# Patient Record
Sex: Female | Born: 1990 | Race: Black or African American | Hispanic: No | Marital: Married | Smoking: Never smoker
Health system: Southern US, Community
[De-identification: ages and names within clinical notes are randomized; demographics above are authoritative.]

## PROBLEM LIST (undated history)

## (undated) DIAGNOSIS — F419 Anxiety disorder, unspecified: Secondary | ICD-10-CM

## (undated) DIAGNOSIS — K429 Umbilical hernia without obstruction or gangrene: Secondary | ICD-10-CM

## (undated) DIAGNOSIS — Z87448 Personal history of other diseases of urinary system: Secondary | ICD-10-CM

## (undated) DIAGNOSIS — N390 Urinary tract infection, site not specified: Secondary | ICD-10-CM

## (undated) DIAGNOSIS — Z87442 Personal history of urinary calculi: Secondary | ICD-10-CM

## (undated) HISTORY — DX: Urinary tract infection, site not specified: N39.0

## (undated) HISTORY — DX: Personal history of other diseases of urinary system: Z87.448

## (undated) HISTORY — DX: Personal history of urinary calculi: Z87.442

## (undated) SURGERY — Surgical Case
Anesthesia: *Unknown

---
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
## 2010-01-06 ENCOUNTER — Emergency Department (HOSPITAL_COMMUNITY): Admission: EM | Admit: 2010-01-06 | Discharge: 2010-01-06 | Payer: Self-pay | Admitting: Emergency Medicine

## 2010-03-13 ENCOUNTER — Emergency Department (HOSPITAL_COMMUNITY): Admission: EM | Admit: 2010-03-13 | Discharge: 2010-03-13 | Payer: Self-pay | Admitting: Emergency Medicine

## 2010-08-01 LAB — URINALYSIS, ROUTINE W REFLEX MICROSCOPIC
Bilirubin Urine: NEGATIVE
Glucose, UA: NEGATIVE mg/dL
Ketones, ur: NEGATIVE mg/dL
Nitrite: NEGATIVE
pH: 6.5 (ref 5.0–8.0)

## 2010-08-01 LAB — WET PREP, GENITAL: Trich, Wet Prep: NONE SEEN

## 2010-08-03 LAB — URINE MICROSCOPIC-ADD ON

## 2010-08-03 LAB — URINALYSIS, ROUTINE W REFLEX MICROSCOPIC
Bilirubin Urine: NEGATIVE
Glucose, UA: NEGATIVE mg/dL
Hgb urine dipstick: NEGATIVE
Ketones, ur: NEGATIVE mg/dL
pH: 6.5 (ref 5.0–8.0)

## 2010-10-02 NOTE — H&P (Signed)
[name], Espinoza              ACCOUNT NO.:  [id number]   MEDICAL RECORD NO.:  [id number]          PATIENT TYPE:  INP   LOCATION:  9371                          [facility]ACILITY:  [facility]   PHYSICIAN:  Roseanna Rainbow, M.Lorraine.DATE OF BIRTH:  01/05/1991   DATE OF ADMISSION:  10/27/2007  DATE OF DISCHARGE:                              HISTORY & PHYSICAL   CHIEF COMPLAINT:  The patient is an 20 year old para 1, 4 days  postpartum with elevated blood pressures and Espinoza headache.   HISTORY OF PRESENT ILLNESS:  The patient reports elevated blood  pressures during the intrapartum and immediate postpartum period.  [facility]  went to visit the patient on the day of presentation and the blood  pressures were noted to be in the 170s/90s.  The patient also complains  of Espinoza unilateral left-sided temporal headache.  She denies any other  neurological complaints.  She does have Espinoza history of allergic sinusitis  and is symptomatic with congestion at present.  She did take Espinoza dose of  Sudafed 1 day prior to presentation.   PAST GYN HISTORY/PAST MEDICAL HISTORY:  1. Headaches.  2. sinusitis.   PAST SURGICAL HISTORY:  No previous surgery.   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 Has no significant smoking  history.  Denies illicit drug use.   FAMILY HISTORY:  Alzheimer's disease, alcoholism arthritis, asthma,  breast cancer and brain cancer, liver cancer, adult-onset diabetes,  hypertension, congestive heart failure.   ALLERGIES:  No known drug allergies.   MEDICATIONS:  Please see the medication reconciliation form.   REVIEW OF SYSTEMS:  NEUROLOGICAL: Please see the above.  She denies  visual disturbances.  GI: She denies epigastric pain.  CARDIAC: She  reports lower extremity edema.   PHYSICAL EXAMINATION:  VITAL SIGNS: Blood pressures 170-180s/110-120s.  GENERAL: Minimal distress.  ABDOMEN:  Nontender.  PELVIC:  Pelvic exams deferred.  EXTREMITIES: 2-3+ pedal and pretibial edema bilaterally.   Urine dip in the office trace protein.   ASSESSMENT:  Rule out severe postpartum pregnancy-induced hypertension.  Questionable neurological symptoms versus sinusitis with an associated  headache.   PLAN:  Admission.  Magnesium sulfate, seizure prophylaxis. Will start  p.o. labetalol for blood pressure control and will check PIH labs.      Roseanna Rainbow, M.Lorraine.  Electronically Signed     LAJ/MEDQ  Lorraine:  10/27/2007  T:  10/27/2007  Job:  [id number]

## 2010-10-05 NOTE — Discharge Summary (Signed)
[name], [name]              ACCOUNT NO.:  [id number]   MEDICAL RECORD NO.:  [id number]          PATIENT TYPE:  INP   LOCATION:  9169                          [facility]  [facility]   PHYSICIAN:  Lorraine Espinoza, M.D.DATE OF BIRTH:  February 17, 1991   DATE OF ADMISSION:  10/09/2007  DATE OF DISCHARGE:  10/11/2007                               DISCHARGE SUMMARY   ADMITTING DIAGNOSIS:  [redacted] weeks gestation, induction of labor.   DISCHARGE DIAGNOSIS:  [redacted] weeks gestation, failed induction of labor.   Discharged home, undelivered at [redacted] weeks gestation in good condition.   REASON FOR ADMISSION:  A 20 year old G1, estimated date of confinement  of Oct 15, 2007, presents for induction of labor.   PAST MEDICAL HISTORY:  Surgery: None.  Illnesses: Headaches.   MEDICATIONS:  Prenatal vitamins.   ALLERGIES:  No known drug allergies.   SOCIAL HISTORY:  Consulting civil engineer, living with significant other.  Negative  tobacco, alcohol, or recreational drug use.   FAMILY HISTORY:  Positive for diabetes, hypertension, and congestive  heart failure.   REVIEW OF SYSTEMS:  Noncontributory.   PHYSICAL EXAMINATION:  Well-nourished, well-developed female, in no  acute distress.  VITAL SIGNS: Afebrile.  Vital signs were stable.  LUNGS: Clear to auscultation bilaterally.  HEART: Regular rate and rhythm.  ABDOMEN: Gravid and nontender.  Cervix long, closed, and vertex at -2  station.   ADMITTING LABS:  Hemoglobin 9.5, hematocrit 28.4, white blood cell count  12,600, and platelets 229,000.  RPR was nonreactive.   HOSPITAL COURSE:  The patient was admitted for 2-stage induction.  Cervidil was placed, and following morning, the cervix was unchanged.  Cervidil was then repeated and after 24 hours of second Cervidil, there  was essentially no change in the cervical ripening and a decision was  made to discontinue induction of labor and discharge the patient home to  follow up in the office for scheduling of reinduction  of labor.  The  patient was therefore discharged home on hospital day #2, undelivered at  [redacted] weeks gestation in good condition.   DISCHARGE DISPOSITION:  Instructions were given for labor and fetal kick  count.  The patient is to continue her prenatal vitamins and iron.  She  is to call the office for a followup appointment with Dr. Tamela Oddi.      Lorraine Espinoza, M.D.  Electronically Signed     CAH/MEDQ  D:  11/13/2007  T:  11/14/2007  Job:  [id number]

## 2010-10-05 NOTE — Discharge Summary (Signed)
Lorraine Espinoza, Lorraine Espinoza              ACCOUNT NO.:  [id number]   MEDICAL RECORD NO.:  [id number]          PATIENT TYPE:  INP   LOCATION:  9306                          [facility]  [facility]   PHYSICIAN:  Charles A. Clearance Coots, M.D.DATE OF BIRTH:  05-14-1991   DATE OF ADMISSION:  10/27/2007  DATE OF DISCHARGE:  10/30/2007                               DISCHARGE SUMMARY   ADMITTING DIAGNOSIS:  Postdate pregnancy, induction of labor.   DISCHARGE DIAGNOSIS:  Postdate pregnancy, induction of labor.   Status post normal spontaneous vaginal delivery, viable female on October 23, 2007.  Apgars of 9 at 1  minute, 9 at 5 minutes, weight of 3450 g,  length of 52.07 cm.  Mother and infant discharged home in good  condition.   REASON FOR ADMISSION:  A 20 year old para 0, estimated date of  confinement was Oct 15, 2007, presents for induction of labor for  postdate.   PAST MEDICAL HISTORY:  Surgery none.  Illnesses none.   MEDICATIONS:  Prenatal vitamins.   ALLERGIES:  No known drug allergies.   SOCIAL HISTORY:  Single.  Negative tobacco, alcohol, or recreational  drug use.   FAMILY HISTORY:  Positive for hypertension, diabetes, and cardiovascular  disease.   REVIEW OF SYSTEMS:  Noncontributory.   PHYSICAL EXAMINATION:  GENERAL: Well-nourished, well-developed female,  in no acute distress.  Afebrile.  VITAL SIGNS: Stable.  LUNGS: Clear to auscultation bilaterally.  HEART: Regular rate and rhythm.  ABDOMEN: Gravid and nontender.  Cervix 1-cm dilated, 6% effaced, vertex  at -3 station admitting.   ADMITTING LABORATORY DATA:  Hemoglobin 9.8, hematocrit 29, white blood  cell count 12,900, and platelets 252,000.  RPR was nonreactive.   HOSPITAL COURSE:  The patient was admitted and Foley bulb cervical  ripening was done in a routine fashion.  The patient was started on  Pitocin.  The following day, after the Foley bulb fell out, progressed  to normal spontaneous vaginal delivery, viable female on October 23, 2007.  There were no complications.  Postpartum course was uncomplicated except  for anemia, but the patient had baseline predelivery anemia with  hemoglobin of 9.8 and postdelivery hemoglobin was 7.2.  The patient had  no clinical hemodynamic changes with ambulation, and therefore  transfusion of blood products was not necessary.  She was started on  hematinic therapy and did well postpartum clinically.   DISCHARGE LABORATORY VALUES:  Hemoglobin 7.2, hematocrit 21.4, white  blood cell count 11,900, and platelets 189,000.   DISCHARGE DISPOSITION:  Ibuprofen was prescribed for pain and iron was  prescribed for anemia.  The patient is to continue prenatal vitamins.  Routine written instructions were given for discharge after vaginal  delivery.  The patient is to call office for followup appointment in 6  weeks.      Charles A. Clearance Coots, M.D.  Electronically Signed     CAH/MEDQ  D:  11/13/2007  T:  11/14/2007  Job:  [id number]

## 2011-02-13 LAB — CBC
HCT: 28.4 — ABNORMAL LOW
MCV: 82.1
Platelets: 229

## 2011-02-13 LAB — RPR: RPR Ser Ql: NONREACTIVE

## 2011-02-14 LAB — CBC
HCT: 29.7 — ABNORMAL LOW
HCT: 30.4 — ABNORMAL LOW
Hemoglobin: 10.1 — ABNORMAL LOW
Hemoglobin: 7.2 — CL
Hemoglobin: 9.4 — ABNORMAL LOW
Hemoglobin: 9.8 — ABNORMAL LOW
MCHC: 32.8
MCHC: 33.4
MCHC: 33.4
MCHC: 33.8
MCV: 80.1
MCV: 82.2
Platelets: 252
Platelets: 284
Platelets: 322
RBC: 2.67 — ABNORMAL LOW
RBC: 3.64 — ABNORMAL LOW
RBC: 3.65 — ABNORMAL LOW
RBC: 3.7 — ABNORMAL LOW
RDW: 16.1 — ABNORMAL HIGH
RDW: 16.5 — ABNORMAL HIGH
RDW: 16.7 — ABNORMAL HIGH
WBC: 11.1 — ABNORMAL HIGH
WBC: 11.9 — ABNORMAL HIGH
WBC: 12.2 — ABNORMAL HIGH
WBC: 12.9 — ABNORMAL HIGH

## 2011-02-14 LAB — URINALYSIS, ROUTINE W REFLEX MICROSCOPIC
Bilirubin Urine: NEGATIVE
Glucose, UA: NEGATIVE
Ketones, ur: 15 — AB
Leukocytes, UA: NEGATIVE
Nitrite: NEGATIVE
Protein, ur: NEGATIVE
Specific Gravity, Urine: <1.005 — ABNORMAL LOW
Urobilinogen, UA: 0.2
pH: 6.5

## 2011-02-14 LAB — COMPREHENSIVE METABOLIC PANEL
ALT: 29
Albumin: 2.5 — ABNORMAL LOW
Albumin: 2.7 — ABNORMAL LOW
Albumin: 2.9 — ABNORMAL LOW
Alkaline Phosphatase: 154 — ABNORMAL HIGH
Alkaline Phosphatase: 160 — ABNORMAL HIGH
BUN: 1 — ABNORMAL LOW
CO2: 24
Calcium: 7.4 — ABNORMAL LOW
Chloride: 104
Creatinine, Ser: 0.66
GFR calc Af Amer: >60
GFR calc non Af Amer: >60
Glucose, Bld: 114 — ABNORMAL HIGH
Glucose, Bld: 115 — ABNORMAL HIGH
Potassium: 2.7 — CL
Sodium: 139
Sodium: 139
Total Bilirubin: 0.4
Total Protein: 5.8 — ABNORMAL LOW

## 2011-02-14 LAB — URIC ACID: Uric Acid, Serum: 7.1 — ABNORMAL HIGH

## 2011-02-14 LAB — URINE MICROSCOPIC-ADD ON

## 2011-02-14 LAB — POTASSIUM: Potassium: 3.2 — ABNORMAL LOW

## 2011-02-14 LAB — MAGNESIUM
Magnesium: 5.5 — ABNORMAL HIGH
Magnesium: 5.6 — ABNORMAL HIGH

## 2011-02-14 LAB — LACTATE DEHYDROGENASE
LDH: 296 — ABNORMAL HIGH
LDH: 301 — ABNORMAL HIGH

## 2011-02-27 LAB — URINALYSIS, ROUTINE W REFLEX MICROSCOPIC
Ketones, ur: 40 — AB
Ketones, ur: NEGATIVE
Leukocytes, UA: NEGATIVE
Nitrite: NEGATIVE
Nitrite: NEGATIVE
Protein, ur: 100 — AB
Protein, ur: 100 — AB
Urobilinogen, UA: 0.2
pH: 6.5

## 2011-02-27 LAB — URINE MICROSCOPIC-ADD ON

## 2011-02-27 LAB — WET PREP, GENITAL: Trich, Wet Prep: NONE SEEN

## 2011-02-28 LAB — STREP A DNA PROBE: Group A Strep Probe: NEGATIVE

## 2013-03-23 ENCOUNTER — Other Ambulatory Visit: Payer: Self-pay

## 2013-04-30 LAB — OB RESULTS CONSOLE ABO/RH: RH TYPE: POSITIVE

## 2013-04-30 LAB — OB RESULTS CONSOLE GC/CHLAMYDIA
CHLAMYDIA, DNA PROBE: NEGATIVE
Gonorrhea: NEGATIVE

## 2013-04-30 LAB — OB RESULTS CONSOLE RUBELLA ANTIBODY, IGM: Rubella: IMMUNE

## 2013-04-30 LAB — OB RESULTS CONSOLE HIV ANTIBODY (ROUTINE TESTING): HIV: NONREACTIVE

## 2013-04-30 LAB — OB RESULTS CONSOLE HEPATITIS B SURFACE ANTIGEN: HEP B S AG: NEGATIVE

## 2013-04-30 LAB — OB RESULTS CONSOLE RPR: RPR: NONREACTIVE

## 2013-04-30 LAB — OB RESULTS CONSOLE ANTIBODY SCREEN: Antibody Screen: NEGATIVE

## 2013-10-15 LAB — OB RESULTS CONSOLE GBS: GBS: NEGATIVE

## 2013-11-16 ENCOUNTER — Telehealth (HOSPITAL_COMMUNITY): Payer: Self-pay | Admitting: *Deleted

## 2013-11-16 ENCOUNTER — Inpatient Hospital Stay (HOSPITAL_COMMUNITY)
Admission: RE | Admit: 2013-11-16 | Discharge: 2013-11-20 | DRG: 766 | Disposition: A | Discharge status: Home / Self Care | Payer: Medicaid Other | Source: Ambulatory Visit | Attending: Obstetrics and Gynecology | Admitting: Obstetrics and Gynecology

## 2013-11-16 ENCOUNTER — Encounter (HOSPITAL_COMMUNITY): Payer: Self-pay | Admitting: *Deleted

## 2013-11-16 ENCOUNTER — Encounter (HOSPITAL_COMMUNITY): Payer: Self-pay

## 2013-11-16 DIAGNOSIS — Z87442 Personal history of urinary calculi: Secondary | ICD-10-CM

## 2013-11-16 DIAGNOSIS — Z3480 Encounter for supervision of other normal pregnancy, unspecified trimester: Secondary | ICD-10-CM

## 2013-11-16 DIAGNOSIS — O480 Post-term pregnancy: Principal | ICD-10-CM | POA: Diagnosis present

## 2013-11-16 LAB — TYPE AND SCREEN
ABO/RH(D): O POS
Antibody Screen: NEGATIVE

## 2013-11-16 LAB — CBC
HCT: 32.6 % — ABNORMAL LOW (ref 36.0–46.0)
Hemoglobin: 10.6 g/dL — ABNORMAL LOW (ref 12.0–15.0)
MCH: 28.1 pg (ref 26.0–34.0)
MCHC: 32.5 g/dL (ref 30.0–36.0)
MCV: 86.5 fL (ref 78.0–100.0)
Platelets: 217 10*3/uL (ref 150–400)
RBC: 3.77 MIL/uL — ABNORMAL LOW (ref 3.87–5.11)
RDW: 14.8 % (ref 11.5–15.5)
WBC: 10 10*3/uL (ref 4.0–10.5)

## 2013-11-16 LAB — ABO/RH: ABO/RH(D): O POS

## 2013-11-16 MED ORDER — LACTATED RINGERS IV SOLN: 500.0000 mL | INTRAVENOUS | Status: DC | PRN

## 2013-11-16 MED ORDER — MISOPROSTOL 25 MCG QUARTER TABLET
25.0000 ug | ORAL_TABLET | ORAL | Status: DC | PRN
  Administered 2013-11-16 – 2013-11-17 (×3): 25 ug via VAGINAL
  Filled 2013-11-16 (×3): qty 0.25

## 2013-11-16 MED ORDER — TERBUTALINE SULFATE 1 MG/ML IJ SOLN: 0.2500 mg | Freq: Once | INTRAMUSCULAR | Status: AC | PRN

## 2013-11-16 MED ORDER — LIDOCAINE HCL (PF) 1 % IJ SOLN
30.0000 mL | INTRAMUSCULAR | Status: DC | PRN
Start: 2013-11-16 — End: 2013-11-18

## 2013-11-16 MED ORDER — IBUPROFEN 600 MG PO TABS: 600.0000 mg | ORAL_TABLET | Freq: Four times a day (QID) | ORAL | Status: DC | PRN

## 2013-11-16 MED ORDER — CITRIC ACID-SODIUM CITRATE 334-500 MG/5ML PO SOLN
30.0000 mL | ORAL | Status: DC | PRN
  Administered 2013-11-18: 30 mL via ORAL
  Filled 2013-11-16: qty 15

## 2013-11-16 MED ORDER — ACETAMINOPHEN 325 MG PO TABS: 650.0000 mg | ORAL_TABLET | ORAL | Status: DC | PRN

## 2013-11-16 MED ORDER — OXYCODONE-ACETAMINOPHEN 5-325 MG PO TABS: 1.0000 | ORAL_TABLET | ORAL | Status: DC | PRN

## 2013-11-16 MED ORDER — OXYTOCIN BOLUS FROM INFUSION: 500.0000 mL | INTRAVENOUS | Status: DC

## 2013-11-16 MED ORDER — ONDANSETRON HCL 4 MG/2ML IJ SOLN: 4.0000 mg | Freq: Four times a day (QID) | INTRAMUSCULAR | Status: DC | PRN

## 2013-11-16 MED ORDER — OXYTOCIN 40 UNITS IN LACTATED RINGERS INFUSION - SIMPLE MED: 62.5000 mL/h | INTRAVENOUS | Status: DC

## 2013-11-16 MED ORDER — LACTATED RINGERS IV SOLN
INTRAVENOUS | Status: DC
  Administered 2013-11-16 – 2013-11-18 (×9): via INTRAVENOUS

## 2013-11-16 NOTE — Telephone Encounter (Signed)
Preadmission screen  

## 2013-11-16 NOTE — H&P (Signed)
23 y.o. 8134w0d  G2P1001 comes in for IOL for pdd.  Otherwise has good fetal movement and no bleeding.  Past Medical History  Diagnosis Date  . Medical history non-contributory   . History of kidney stones   . Hx of pyelonephritis     Past Surgical History  Procedure Laterality Date  . No past surgeries      OB History  Gravida Para Term Preterm AB SAB TAB Ectopic Multiple Living  2 1 1       1     # Outcome Date GA Lbr Len/2nd Weight Sex Delivery Anes PTL Lv  2 CUR           1 TRM 2009 8434w0d  3.43 kg (7 lb 9 oz) F SVD EPI  Y      History   Social History  . Marital Status: Married    Spouse Name: N/A    Number of Children: N/A  . Years of Education: N/A   Occupational History  . Not on file.   Social History Main Topics  . Smoking status: Never Smoker   . Smokeless tobacco: Never Used  . Alcohol Use: No  . Drug Use: No  . Sexual Activity: Yes   Other Topics Concern  . Not on file   Social History Narrative  . No narrative on file   Review of patient's allergies indicates no known allergies.    Prenatal Transfer Tool  Maternal Diabetes: No Genetic Screening: Normal Maternal Ultrasounds/Referrals: Normal Fetal Ultrasounds or other Referrals:  None Maternal Substance Abuse:  No Significant Maternal Medications:  None Significant Maternal Lab Results: Lab values include: Group B Strep negative  Other PNC: uncomplicated.    Filed Vitals:   11/16/13 2028  BP: 136/89  Pulse: 72  Temp:   Resp: 18     Lungs/Cor:  NAD Abdomen:  soft, gravid Ex:  no cords, erythema SVE:  1/50/-3/posterior FHTs:  130, good STV, NST R Toco:  q1-4   A/P   Admit for IOL  Cytotec for cervical ripening  Call with request for IV pain medication  GBS Neg  CALLAHAN, SIDNEY

## 2013-11-17 ENCOUNTER — Encounter (HOSPITAL_COMMUNITY): Payer: Medicaid Other | Admitting: Anesthesiology

## 2013-11-17 ENCOUNTER — Inpatient Hospital Stay (HOSPITAL_COMMUNITY): Payer: Medicaid Other | Admitting: Anesthesiology

## 2013-11-17 ENCOUNTER — Encounter: Payer: Self-pay | Admitting: *Deleted

## 2013-11-17 LAB — RPR

## 2013-11-17 MED ORDER — PHENYLEPHRINE 40 MCG/ML (10ML) SYRINGE FOR IV PUSH (FOR BLOOD PRESSURE SUPPORT)
80.0000 ug | PREFILLED_SYRINGE | INTRAVENOUS | Status: DC | PRN
  Filled 2013-11-17: qty 10

## 2013-11-17 MED ORDER — EPHEDRINE 5 MG/ML INJ: 10.0000 mg | INTRAVENOUS | Status: DC | PRN

## 2013-11-17 MED ORDER — OXYTOCIN 40 UNITS IN LACTATED RINGERS INFUSION - SIMPLE MED
1.0000 m[IU]/min | INTRAVENOUS | Status: DC
  Administered 2013-11-17: 2 m[IU]/min via INTRAVENOUS
  Filled 2013-11-17: qty 1000

## 2013-11-17 MED ORDER — PHENYLEPHRINE 40 MCG/ML (10ML) SYRINGE FOR IV PUSH (FOR BLOOD PRESSURE SUPPORT): 80.0000 ug | PREFILLED_SYRINGE | INTRAVENOUS | Status: DC | PRN

## 2013-11-17 MED ORDER — EPHEDRINE 5 MG/ML INJ
10.0000 mg | INTRAVENOUS | Status: DC | PRN
  Filled 2013-11-17: qty 4

## 2013-11-17 MED ORDER — TERBUTALINE SULFATE 1 MG/ML IJ SOLN
0.2500 mg | Freq: Once | INTRAMUSCULAR | Status: AC | PRN
  Administered 2013-11-17: 0.25 mg via SUBCUTANEOUS
  Filled 2013-11-17: qty 1

## 2013-11-17 MED ORDER — FENTANYL 2.5 MCG/ML BUPIVACAINE 1/10 % EPIDURAL INFUSION (WH - ANES)
14.0000 mL/h | INTRAMUSCULAR | Status: DC | PRN
  Administered 2013-11-17 (×2): 14 mL/h via EPIDURAL
  Filled 2013-11-17 (×2): qty 125

## 2013-11-17 MED ORDER — LACTATED RINGERS IV SOLN
INTRAVENOUS | Status: DC
  Administered 2013-11-17: 17:00:00 via INTRAUTERINE

## 2013-11-17 MED ORDER — LIDOCAINE HCL (PF) 1 % IJ SOLN
INTRAMUSCULAR | Status: DC | PRN
  Administered 2013-11-17 (×2): 5 mL

## 2013-11-17 MED ORDER — LACTATED RINGERS IV SOLN: 500.0000 mL | Freq: Once | INTRAVENOUS | Status: DC

## 2013-11-17 MED ORDER — NALBUPHINE HCL 10 MG/ML IJ SOLN
10.0000 mg | INTRAMUSCULAR | Status: DC | PRN
  Administered 2013-11-17: 10 mg via INTRAVENOUS
  Filled 2013-11-17 (×2): qty 1

## 2013-11-17 MED ORDER — DIPHENHYDRAMINE HCL 50 MG/ML IJ SOLN: 12.5000 mg | INTRAMUSCULAR | Status: DC | PRN

## 2013-11-17 NOTE — Progress Notes (Signed)
Dr Dareen Pianoanderson aware cytotec was placed an hour ago, and orders rec to start pitocin

## 2013-11-17 NOTE — Anesthesia Preprocedure Evaluation (Signed)

## 2013-11-17 NOTE — Anesthesia Procedure Notes (Signed)
Epidural Patient location during procedure: OB Start time: 11/17/2013 5:41 PM  Staffing Anesthesiologist: Brayton CavesJACKSON, Eudell Mcphee Performed by: anesthesiologist   Preanesthetic Checklist Completed: patient identified, site marked, surgical consent, pre-op evaluation, timeout performed, IV checked, risks and benefits discussed and monitors and equipment checked  Epidural Patient position: sitting Prep: site prepped and draped and DuraPrep Patient monitoring: continuous pulse ox and blood pressure Approach: midline Location: L3-L4 Injection technique: LOR air  Needle:  Needle type: Tuohy  Needle gauge: 17 G Needle length: 9 cm and 9 Needle insertion depth: 6 cm Catheter type: closed end flexible Catheter size: 19 Gauge Catheter at skin depth: 10 cm Test dose: negative  Assessment Events: blood not aspirated, injection not painful, no injection resistance, negative IV test and no paresthesia  Additional Notes Patient identified.  Risk benefits discussed including failed block, incomplete pain control, headache, nerve damage, paralysis, blood pressure changes, nausea, vomiting, reactions to medication both toxic or allergic, and postpartum back pain.  Patient expressed understanding and wished to proceed.  All questions were answered.  Sterile technique used throughout procedure and epidural site dressed with sterile barrier dressing. No paresthesia or other complications noted.The patient did not experience any signs of intravascular injection such as tinnitus or metallic taste in mouth nor signs of intrathecal spread such as rapid motor block. Please see nursing notes for vital signs.

## 2013-11-18 ENCOUNTER — Encounter (HOSPITAL_COMMUNITY)
Admission: RE | Disposition: A | Discharge status: Home / Self Care | Payer: Self-pay | Source: Ambulatory Visit | Attending: Obstetrics and Gynecology

## 2013-11-18 ENCOUNTER — Encounter (HOSPITAL_COMMUNITY): Payer: Self-pay

## 2013-11-18 DIAGNOSIS — Z3480 Encounter for supervision of other normal pregnancy, unspecified trimester: Secondary | ICD-10-CM

## 2013-11-18 LAB — CBC
HEMATOCRIT: 27 % — AB (ref 36.0–46.0)
HEMOGLOBIN: 8.6 g/dL — AB (ref 12.0–15.0)
MCH: 27.5 pg (ref 26.0–34.0)
MCHC: 31.9 g/dL (ref 30.0–36.0)
MCV: 86.3 fL (ref 78.0–100.0)
Platelets: 199 10*3/uL (ref 150–400)
RBC: 3.13 MIL/uL — AB (ref 3.87–5.11)
RDW: 14.9 % (ref 11.5–15.5)
WBC: 15.2 10*3/uL — AB (ref 4.0–10.5)

## 2013-11-18 SURGERY — Surgical Case
Anesthesia: Epidural

## 2013-11-18 MED ORDER — CEFAZOLIN SODIUM-DEXTROSE 2-3 GM-% IV SOLR
INTRAVENOUS | Status: AC
  Filled 2013-11-18: qty 50

## 2013-11-18 MED ORDER — OXYTOCIN 10 UNIT/ML IJ SOLN
INTRAMUSCULAR | Status: AC
  Filled 2013-11-18: qty 4

## 2013-11-18 MED ORDER — TETANUS-DIPHTH-ACELL PERTUSSIS 5-2.5-18.5 LF-MCG/0.5 IM SUSP: 0.5000 mL | Freq: Once | INTRAMUSCULAR | Status: DC

## 2013-11-18 MED ORDER — SCOPOLAMINE 1 MG/3DAYS TD PT72
MEDICATED_PATCH | TRANSDERMAL | Status: AC
  Filled 2013-11-18: qty 1

## 2013-11-18 MED ORDER — SODIUM CHLORIDE 0.9 % IJ SOLN: 3.0000 mL | INTRAMUSCULAR | Status: DC | PRN

## 2013-11-18 MED ORDER — ONDANSETRON HCL 4 MG/2ML IJ SOLN
INTRAMUSCULAR | Status: AC
  Filled 2013-11-18: qty 2

## 2013-11-18 MED ORDER — DIBUCAINE 1 % RE OINT: 1.0000 "application " | TOPICAL_OINTMENT | RECTAL | Status: DC | PRN

## 2013-11-18 MED ORDER — LACTATED RINGERS IV SOLN
INTRAVENOUS | Status: DC | PRN
  Administered 2013-11-18 (×2): via INTRAVENOUS

## 2013-11-18 MED ORDER — WITCH HAZEL-GLYCERIN EX PADS: 1.0000 "application " | MEDICATED_PAD | CUTANEOUS | Status: DC | PRN

## 2013-11-18 MED ORDER — KETOROLAC TROMETHAMINE 30 MG/ML IJ SOLN
30.0000 mg | Freq: Four times a day (QID) | INTRAMUSCULAR | Status: AC | PRN
  Administered 2013-11-18: 30 mg via INTRAMUSCULAR

## 2013-11-18 MED ORDER — SCOPOLAMINE 1 MG/3DAYS TD PT72
1.0000 | MEDICATED_PATCH | Freq: Once | TRANSDERMAL | Status: DC
  Administered 2013-11-18: 1.5 mg via TRANSDERMAL

## 2013-11-18 MED ORDER — OXYTOCIN 10 UNIT/ML IJ SOLN
40.0000 [IU] | INTRAMUSCULAR | Status: DC | PRN
  Administered 2013-11-18 (×2): 40 [IU] via INTRAVENOUS

## 2013-11-18 MED ORDER — KETOROLAC TROMETHAMINE 30 MG/ML IJ SOLN: 30.0000 mg | Freq: Four times a day (QID) | INTRAMUSCULAR | Status: AC | PRN

## 2013-11-18 MED ORDER — MEPERIDINE HCL 25 MG/ML IJ SOLN: 6.2500 mg | INTRAMUSCULAR | Status: DC | PRN

## 2013-11-18 MED ORDER — NALOXONE HCL 0.4 MG/ML IJ SOLN
0.4000 mg | INTRAMUSCULAR | Status: DC | PRN
Start: 2013-11-18 — End: 2013-11-20

## 2013-11-18 MED ORDER — IBUPROFEN 600 MG PO TABS
600.0000 mg | ORAL_TABLET | Freq: Four times a day (QID) | ORAL | Status: DC
  Administered 2013-11-18 – 2013-11-20 (×7): 600 mg via ORAL
  Filled 2013-11-18 (×8): qty 1

## 2013-11-18 MED ORDER — SODIUM BICARBONATE 8.4 % IV SOLN
INTRAVENOUS | Status: AC
  Filled 2013-11-18: qty 50

## 2013-11-18 MED ORDER — DIPHENHYDRAMINE HCL 50 MG/ML IJ SOLN: 12.5000 mg | INTRAMUSCULAR | Status: DC | PRN

## 2013-11-18 MED ORDER — DIPHENHYDRAMINE HCL 50 MG/ML IJ SOLN: 25.0000 mg | INTRAMUSCULAR | Status: DC | PRN

## 2013-11-18 MED ORDER — NALBUPHINE HCL 10 MG/ML IJ SOLN: 5.0000 mg | INTRAMUSCULAR | Status: DC | PRN

## 2013-11-18 MED ORDER — DIPHENHYDRAMINE HCL 25 MG PO CAPS: 25.0000 mg | ORAL_CAPSULE | ORAL | Status: DC | PRN

## 2013-11-18 MED ORDER — LIDOCAINE-EPINEPHRINE (PF) 2 %-1:200000 IJ SOLN
INTRAMUSCULAR | Status: AC
  Filled 2013-11-18: qty 20

## 2013-11-18 MED ORDER — FERROUS FUMARATE 325 (106 FE) MG PO TABS
1.0000 | ORAL_TABLET | Freq: Two times a day (BID) | ORAL | Status: DC
  Administered 2013-11-18 – 2013-11-20 (×3): 106 mg via ORAL
  Filled 2013-11-18 (×5): qty 1

## 2013-11-18 MED ORDER — SIMETHICONE 80 MG PO CHEW
80.0000 mg | CHEWABLE_TABLET | ORAL | Status: DC
  Administered 2013-11-19 (×2): 80 mg via ORAL
  Filled 2013-11-18 (×3): qty 1

## 2013-11-18 MED ORDER — PHENYLEPHRINE 40 MCG/ML (10ML) SYRINGE FOR IV PUSH (FOR BLOOD PRESSURE SUPPORT)
PREFILLED_SYRINGE | INTRAVENOUS | Status: AC
  Filled 2013-11-18: qty 5

## 2013-11-18 MED ORDER — MORPHINE SULFATE 0.5 MG/ML IJ SOLN
INTRAMUSCULAR | Status: AC
  Filled 2013-11-18: qty 10

## 2013-11-18 MED ORDER — ONDANSETRON HCL 4 MG/2ML IJ SOLN: 4.0000 mg | INTRAMUSCULAR | Status: DC | PRN

## 2013-11-18 MED ORDER — KETOROLAC TROMETHAMINE 30 MG/ML IJ SOLN
INTRAMUSCULAR | Status: AC
  Administered 2013-11-18: 30 mg via INTRAMUSCULAR
  Filled 2013-11-18: qty 1

## 2013-11-18 MED ORDER — OXYCODONE-ACETAMINOPHEN 5-325 MG PO TABS
1.0000 | ORAL_TABLET | ORAL | Status: DC | PRN
  Administered 2013-11-19 – 2013-11-20 (×2): 1 via ORAL
  Filled 2013-11-18 (×3): qty 1

## 2013-11-18 MED ORDER — OXYTOCIN 40 UNITS IN LACTATED RINGERS INFUSION - SIMPLE MED: 62.5000 mL/h | INTRAVENOUS | Status: AC

## 2013-11-18 MED ORDER — SODIUM BICARBONATE 8.4 % IV SOLN
INTRAVENOUS | Status: DC | PRN
  Administered 2013-11-18 (×2): 5 mL via EPIDURAL
  Administered 2013-11-18: 3 mL via EPIDURAL
  Administered 2013-11-18: 5 mL via EPIDURAL

## 2013-11-18 MED ORDER — ONDANSETRON HCL 4 MG/2ML IJ SOLN
INTRAMUSCULAR | Status: DC | PRN
  Administered 2013-11-18: 4 mg via INTRAVENOUS

## 2013-11-18 MED ORDER — NALBUPHINE HCL 10 MG/ML IJ SOLN
5.0000 mg | INTRAMUSCULAR | Status: DC | PRN
  Administered 2013-11-18: 10 mg via INTRAVENOUS
  Filled 2013-11-18: qty 1

## 2013-11-18 MED ORDER — MEPERIDINE HCL 25 MG/ML IJ SOLN
INTRAMUSCULAR | Status: AC
  Filled 2013-11-18: qty 1

## 2013-11-18 MED ORDER — METOCLOPRAMIDE HCL 5 MG/ML IJ SOLN: 10.0000 mg | Freq: Three times a day (TID) | INTRAMUSCULAR | Status: DC | PRN

## 2013-11-18 MED ORDER — LACTATED RINGERS IV SOLN: INTRAVENOUS | Status: DC

## 2013-11-18 MED ORDER — CEFAZOLIN SODIUM-DEXTROSE 2-3 GM-% IV SOLR
INTRAVENOUS | Status: DC | PRN
  Administered 2013-11-18: 2 g via INTRAVENOUS

## 2013-11-18 MED ORDER — ONDANSETRON HCL 4 MG PO TABS: 4.0000 mg | ORAL_TABLET | ORAL | Status: DC | PRN

## 2013-11-18 MED ORDER — MEASLES, MUMPS & RUBELLA VAC ~~LOC~~ INJ
0.5000 mL | INJECTION | Freq: Once | SUBCUTANEOUS | Status: DC
  Filled 2013-11-18: qty 0.5

## 2013-11-18 MED ORDER — HYDROMORPHONE HCL PF 1 MG/ML IJ SOLN: 0.2500 mg | INTRAMUSCULAR | Status: DC | PRN

## 2013-11-18 MED ORDER — MEPERIDINE HCL 25 MG/ML IJ SOLN
INTRAMUSCULAR | Status: DC | PRN
  Administered 2013-11-18 (×2): 12.5 mg via INTRAVENOUS

## 2013-11-18 MED ORDER — PRENATAL MULTIVITAMIN CH
1.0000 | ORAL_TABLET | Freq: Every day | ORAL | Status: DC
  Administered 2013-11-18 – 2013-11-19 (×2): 1 via ORAL
  Filled 2013-11-18 (×2): qty 1

## 2013-11-18 MED ORDER — ONDANSETRON HCL 4 MG/2ML IJ SOLN: 4.0000 mg | Freq: Three times a day (TID) | INTRAMUSCULAR | Status: DC | PRN

## 2013-11-18 MED ORDER — NALOXONE HCL 1 MG/ML IJ SOLN
1.0000 ug/kg/h | INTRAVENOUS | Status: DC | PRN
  Filled 2013-11-18: qty 2

## 2013-11-18 MED ORDER — MORPHINE SULFATE (PF) 0.5 MG/ML IJ SOLN
INTRAMUSCULAR | Status: DC | PRN
  Administered 2013-11-18: 4 mg via EPIDURAL

## 2013-11-18 MED ORDER — SENNOSIDES-DOCUSATE SODIUM 8.6-50 MG PO TABS
2.0000 | ORAL_TABLET | ORAL | Status: DC
  Administered 2013-11-19: 2 via ORAL
  Administered 2013-11-19: 1 via ORAL
  Filled 2013-11-18 (×2): qty 2

## 2013-11-18 MED ORDER — ZOLPIDEM TARTRATE 5 MG PO TABS: 5.0000 mg | ORAL_TABLET | Freq: Every evening | ORAL | Status: DC | PRN

## 2013-11-18 SURGICAL SUPPLY — 29 items
CLAMP CORD UMBIL (MISCELLANEOUS) IMPLANT
CLOTH BEACON ORANGE TIMEOUT ST (SAFETY) ×3 IMPLANT
CONTAINER PREFILL 10% NBF 15ML (MISCELLANEOUS) IMPLANT
DRAPE LG THREE QUARTER DISP (DRAPES) IMPLANT
DRSG OPSITE POSTOP 4X10 (GAUZE/BANDAGES/DRESSINGS) ×3 IMPLANT
DURAPREP 26ML APPLICATOR (WOUND CARE) ×3 IMPLANT
ELECT REM PT RETURN 9FT ADLT (ELECTROSURGICAL) ×3
ELECTRODE REM PT RTRN 9FT ADLT (ELECTROSURGICAL) ×1 IMPLANT
EXTRACTOR VACUUM M CUP 4 TUBE (SUCTIONS) IMPLANT
EXTRACTOR VACUUM M CUP 4' TUBE (SUCTIONS)
GLOVE ECLIPSE 7.0 STRL STRAW (GLOVE) ×6 IMPLANT
GOWN STRL REUS W/TWL LRG LVL3 (GOWN DISPOSABLE) ×6 IMPLANT
KIT ABG SYR 3ML LUER SLIP (SYRINGE) IMPLANT
NEEDLE HYPO 25X5/8 SAFETYGLIDE (NEEDLE) IMPLANT
NS IRRIG 1000ML POUR BTL (IV SOLUTION) ×3 IMPLANT
PACK C SECTION WH (CUSTOM PROCEDURE TRAY) ×3 IMPLANT
PAD OB MATERNITY 4.3X12.25 (PERSONAL CARE ITEMS) ×3 IMPLANT
RETRACTOR WND ALEXIS 25 LRG (MISCELLANEOUS) ×1 IMPLANT
RETRACTOR WOUND ALXS 34CM XLRG (MISCELLANEOUS) IMPLANT
RTRCTR WOUND ALEXIS 25CM LRG (MISCELLANEOUS) ×3
RTRCTR WOUND ALEXIS 34CM XLRG (MISCELLANEOUS)
STAPLER VISISTAT 35W (STAPLE) IMPLANT
SUT MNCRL 0 VIOLET CTX 36 (SUTURE) ×3 IMPLANT
SUT MON AB 2-0 CT1 27 (SUTURE) ×6 IMPLANT
SUT MONOCRYL 0 CTX 36 (SUTURE) ×6
SUT PLAIN 0 NONE (SUTURE) IMPLANT
TOWEL OR 17X24 6PK STRL BLUE (TOWEL DISPOSABLE) ×3 IMPLANT
TRAY FOLEY CATH 14FR (SET/KITS/TRAYS/PACK) IMPLANT
WATER STERILE IRR 1000ML POUR (IV SOLUTION) ×3 IMPLANT

## 2013-11-18 NOTE — Op Note (Signed)
Lorraine Espinoza, Lorraine Espinoza           ACCOUNT NO.:  [id number]  MEDICAL RECORD NO.:  [id number]  LOCATION:  [facility]                          [facility]  PHYSICIAN:  Malva LimesMark Anderson, M.D.    DATE OF BIRTH:  1990/12/13  DATE OF PROCEDURE:  11/18/2013 DATE OF DISCHARGE:                              OPERATIVE REPORT   PREOPERATIVE DIAGNOSES: 1. Intrauterine pregnancy at 41+ weeks. 2. Fetal intolerance to labor.  POSTOPERATIVE DIAGNOSES: 1. Intrauterine pregnancy at 41+ weeks. 2. Fetal intolerance to labor. 3. Nuchal cord x1.  SURGEON:  Malva LimesMark Anderson, M.D.  ANESTHESIA:  Epidural.  ANTIBIOTICS:  Ancef 2 g.  DRAINS:  Foley bedside drainage.  ESTIMATED BLOOD LOSS:  900 mL.  SPECIMENS:  None.  FINDINGS:  The patient had normal fallopian tubes and ovaries bilaterally.  She delivered 1 live viable black infant in the vertex presentation.  The Apgar was 8 at 1  minute and 9 at 5 minutes.  There was nuchal cord x1.  DESCRIPTION OF PROCEDURE:  The patient was taken to the operating room where her epidural anesthetic was reinjected.  Once an adequate level was reached, she was prepped and draped in usual fashion for this procedure.  A Pfannenstiel incision was made 2 cm above the pubic symphysis.  On entering the abdominal cavity, the bladder flap was taken down with sharp dissection.  A low-transverse uterine incision was made in the midline with Metzenbaum scissors and extended laterally with blunt dissection.  Amniotic fluid was noted to be clear.  The uterine incision was extended laterally with blunt dissection.  The infant was delivered in the vertex presentation.  On delivery of the head, the oropharynx and nostrils were bulb suctioned.  The remaining infant was then delivered.  The cord was doubly clamped and cut and the infant handed to the awaiting NICU team.  The placenta was then manually removed.  The uterus was exteriorized.  The uterine cavity was wiped with a wet lap  and examined.  The uterine incision was closed in a single layer of 0 Monocryl suture in a running, locking fashion.  The bladder flap was closed using 2-0 Monocryl suture in a running fashion. Ovaries and tubes were inspected and found to be normal.  The uterus was placed back in the abdominal cavity.  Hemostasis was rechecked and felt to be adequate.  The parietal peritoneum and rectus muscles were reapproximated in the midline using 2-0 Monocryl in a running fashion.  The fascia was closed using 0 Monocryl suture in running fashion.  Stainless steel clips were used to close the skin.  The patient tolerated the procedure well.  She was taken to recovery room in stable condition.  Instrument and lap counts were correct x2.          ______________________________ Malva LimesMark Anderson, M.D.     MA/MEDQ  D:  11/18/2013  T:  11/18/2013  Job:  [id number]

## 2013-11-18 NOTE — Addendum Note (Signed)
Addendum created 11/18/13 0801 by Gertie Feyynthia R Walker, CRNA   Modules edited: Notes Section   Notes Section:  File: [id number]

## 2013-11-18 NOTE — Anesthesia Postprocedure Evaluation (Signed)
  Anesthesia Post-op Note  Patient: Lorraine Espinoza  Procedure(s) Performed: Procedure(s): CESAREAN SECTION (N/A)   Patient is awake, responsive, moving her legs, and has signs of resolution of her numbness. Pain and nausea are reasonably well controlled. Vital signs are stable and clinically acceptable. Oxygen saturation is clinically acceptable. There are no apparent anesthetic complications at this time. Patient is ready for discharge.

## 2013-11-18 NOTE — Anesthesia Postprocedure Evaluation (Signed)
  Anesthesia Post-op Note  Patient: Pauline GoodMarlinda Gavigan  Procedure(s) Performed: Procedure(s): CESAREAN SECTION (N/A)  Patient Location: Mother/Baby  Anesthesia Type:Epidural  Level of Consciousness: awake, alert  and oriented  Airway and Oxygen Therapy: Patient Spontanous Breathing  Post-op Pain: none  Post-op Assessment: Post-op Vital signs reviewed and Patient's Cardiovascular Status Stable  Post-op Vital Signs: Reviewed and stable  Last Vitals:  Filed Vitals:   11/18/13 0613  BP: 115/61  Pulse: 83  Temp: 37.1 C  Resp: 18    Complications: No apparent anesthesia complications

## 2013-11-18 NOTE — Progress Notes (Signed)
Pt had an amniotomy this am. Little AF was noted. She has had variable decels through out the day. She had an amnioinfusion placed. She has had two prolonged decels, one txd with terb. She is now having late decels. She has made little change in her cx since this am. It is difficult to get her ctxs adequate because of the decels. Will go to the or for a C/S for fetal intolerance to labor.

## 2013-11-18 NOTE — Transfer of Care (Signed)
Immediate Anesthesia Transfer of Care Note  Patient: Lorraine Espinoza  Procedure(s) Performed: Procedure(s): CESAREAN SECTION (N/A)  Patient Location: PACU  Anesthesia Type:Epidural  Level of Consciousness: awake, alert , oriented and patient cooperative  Airway & Oxygen Therapy: Patient Spontanous Breathing  Post-op Assessment: Report given to PACU RN and Post -op Vital signs reviewed and stable  Post vital signs: Reviewed and stable  Complications: No apparent anesthesia complications

## 2013-11-18 NOTE — Progress Notes (Addendum)
  Patient is eating, ambulating, cath still in.  Pain control is good.  Filed Vitals:   11/18/13 0303 11/18/13 0415 11/18/13 0515 11/18/13 0613  BP: 114/66 120/59 112/60 115/61  Pulse: 85 80 79 83  Temp: 99 F (37.2 C) 98.1 F (36.7 C) 98.9 F (37.2 C) 98.7 F (37.1 C)  TempSrc: Oral Oral Oral Oral  Resp: 16 18 18 18   Height:      Weight:      SpO2: 100% 98% 96% 96%    lungs:   clear to auscultation cor:    RRR Abdomen:  soft, appropriate tenderness, incisions intact and without erythema or exudate ex:    no cords   Lab Results  Component Value Date   WBC 15.2* 11/18/2013   HGB 8.6* 11/18/2013   HCT 27.0* 11/18/2013   MCV 86.3 11/18/2013   PLT 199 11/18/2013    --/--/O POS, O POS (06/30 2015)/RI  A/P    Post operative day 1.  Routine post op and postpartum care.  Expect d/c tomorrow.  Percocet for pain control.  Mild anemia- iron.

## 2013-11-18 NOTE — Progress Notes (Signed)
UR chart review completed.  

## 2013-11-19 ENCOUNTER — Encounter (HOSPITAL_COMMUNITY): Payer: Self-pay | Admitting: Obstetrics and Gynecology

## 2013-11-19 LAB — BIRTH TISSUE RECOVERY COLLECTION (PLACENTA DONATION)

## 2013-11-19 NOTE — Lactation Note (Signed)
This note was copied from the chart of Lorraine Espinoza Wentzel. Lactation Consultation Note Initial visit at 44 hours of age.  Mom plans to pump exclusively to offer breast milk to baby.  Mom has an 24 year old that she didn't breastfeed.  Mom has not been able to express colostrum, but demonstrates hand expression well.  Mom has only pumped twice today, encouraged her to pump every 3 hours to total 8/24 hours to establish milk supply.  Questions about pumping and rentals discussed.  [facility] given and discussed.  Follow up PRN.   Patient Name: Lorraine Espinoza Saari JXBJY'NToday's Date: 11/19/2013 Reason for consult: Initial assessment   Maternal Data    Feeding Feeding Type: Bottle Fed - Formula  LATCH Score/Interventions                      Lactation Tools Discussed/Used     Consult Status Consult Status: PRN    Jannifer RodneyShoptaw, Imanie Darrow Lynn 11/19/2013, 9:22 PM

## 2013-11-20 MED ORDER — IBUPROFEN 600 MG PO TABS: 600.0000 mg | ORAL_TABLET | Freq: Four times a day (QID) | ORAL | Status: DC | PRN

## 2013-11-20 MED ORDER — OXYCODONE-ACETAMINOPHEN 5-325 MG PO TABS: 1.0000 | ORAL_TABLET | ORAL | Status: DC | PRN

## 2013-11-20 MED ORDER — DOCUSATE SODIUM 100 MG PO CAPS: 100.0000 mg | ORAL_CAPSULE | Freq: Two times a day (BID) | ORAL | Status: DC

## 2013-11-20 NOTE — Progress Notes (Signed)
Subjective: Postpartum Day 2: Cesarean Delivery Patient reports pain controlled, no nausea or vomiting   Objective: Vital signs in last 24 hours: Temp:  [97.8 F (36.6 C)-98.2 F (36.8 C)] 97.8 F (36.6 C) (07/04 0637) Pulse Rate:  [84-88] 84 (07/04 0637) Resp:  [17-18] 17 (07/04 0637) BP: (101-103)/(68-70) 103/68 mmHg (07/04 0637) SpO2:  [100 %] 100 % (07/04 16100637)  Physical Exam:  General: alert, cooperative and appears stated age Lochia: appropriate Uterine Fundus: firm   Recent Labs  11/18/13 0545  HGB 8.6*  HCT 27.0*    Assessment/Plan: Status post Cesarean section. Doing well postoperatively.  Continue current care.  Lorraine Espinoza H. 11/20/2013, 9:55 AM

## 2013-11-20 NOTE — Discharge Summary (Signed)
Obstetric Discharge Summary Reason for Admission: induction of labor Prenatal Procedures: NST and ultrasound Intrapartum Procedures: cesarean: low cervical, transverse Postpartum Procedures: none Complications-Operative and Postpartum: None Hemoglobin  Date Value Ref Range Status  11/18/2013 8.6* 12.0 - 15.0 g/dL Final     REPEATED TO VERIFY     DELTA CHECK NOTED     HCT  Date Value Ref Range Status  11/18/2013 27.0* 36.0 - 46.0 % Final    Physical Exam:  General: alert, cooperative and appears stated age 73Lochia: appropriate Uterine Fundus: firm Incision: healing well DVT Evaluation: No evidence of DVT seen on physical exam.  Discharge Diagnoses: Term Pregnancy-delivered  Discharge Information: Date: 11/20/2013 Activity: pelvic rest Diet: routine Medications: Ibuprofen, Colace and Percocet Condition: improved Instructions: refer to practice specific booklet Discharge to: home Follow-up Information   Follow up with Levi AlandANDERSON,MARK E, MD In 4 weeks. (For a postpartum evaluation)    Specialty:  Obstetrics and Gynecology   Contact information:   [address] [place] [id number] [phone]       Newborn Data: Live born female  Birth Weight: 7 lb 11.5 oz (3501 g) APGAR: 8, 9  Home with mother.  Lorraine Lothrop H. 11/20/2013, 10:33 AM

## 2014-03-21 ENCOUNTER — Encounter (HOSPITAL_COMMUNITY): Payer: Self-pay | Admitting: Obstetrics and Gynecology

## 2015-08-31 ENCOUNTER — Ambulatory Visit: Payer: Self-pay | Admitting: General Surgery

## 2015-08-31 NOTE — H&P (Signed)
History of Present Illness Lorraine Carl MD; 08/31/2015 10:09 AM) Patient words: New-umb hernia.  The patient is a 25 year old female who presents with an umbilical hernia. Patient has a two-year history of umbilical hernia occurring after her second child. Previously was causing mild pain and she was able to reduce in supine position. 10 days ago she was in the urgent care office because the pain was worse and she was now able to reduce it on her own. Pain is mainly a burning sensation directly overlying the area and it did stick out to the size of a golf ball at the time. She has symptoms on a daily basis especially with walking. This limits her ability to exercise. She takes ibuprofen with minimal benefit. She has 2 bowel movements per week.  She does not smoke, she does have obesity, she does not have diabetes, and her only other surgery is a C-section.   Other Problems Doristine Devoid, CMA; 08/31/2015 9:31 AM) Anxiety Disorder Back Pain Bladder Problems Hemorrhoids Umbilical Hernia Repair  Past Surgical History Doristine Devoid, CMA; 08/31/2015 9:31 AM) Cesarean Section - 1  Diagnostic Studies History Doristine Devoid, CMA; 08/31/2015 9:31 AM) Colonoscopy never Mammogram never Pap Smear 1-5 years ago  Allergies Doristine Devoid, CMA; 08/31/2015 9:32 AM) No Known Drug Allergies04/13/2017  Medication History Doristine Devoid, CMA; 08/31/2015 9:32 AM) No Current Medications Medications Reconciled  Social History Doristine Devoid, CMA; 08/31/2015 9:31 AM) Alcohol use Occasional alcohol use. Caffeine use Carbonated beverages. Illicit drug use Prefer to discuss with provider.  Family History Doristine Devoid, CMA; 08/31/2015 9:31 AM) Alcohol Abuse Family Members In General. Arthritis Mother. Colon Cancer Father. Diabetes Mellitus Family Members In General. Hypertension Father, Mother. Thyroid problems Mother.  Pregnancy / Birth History Doristine Devoid, CMA;  08/31/2015 9:31 AM) Age at menarche 10 years. Contraceptive History Oral contraceptives. Gravida 2 Maternal age 41-20 Para 2 Regular periods    Review of Systems Doristine Devoid CMA; 08/31/2015 9:31 AM) General Present- Appetite Loss, Fatigue, Night Sweats and Weight Gain. Not Present- Chills, Fever and Weight Loss. Skin Present- Dryness and Hives. Not Present- Change in Wart/Mole, Jaundice, New Lesions, Non-Healing Wounds, Rash and Ulcer. HEENT Present- Ringing in the Ears, Seasonal Allergies and Sinus Pain. Not Present- Earache, Hearing Loss, Hoarseness, Nose Bleed, Oral Ulcers, Sore Throat, Visual Disturbances, Wears glasses/contact lenses and Yellow Eyes. Cardiovascular Present- Leg Cramps. Not Present- Chest Pain, Difficulty Breathing Lying Down, Palpitations, Rapid Heart Rate, Shortness of Breath and Swelling of Extremities. Gastrointestinal Present- Abdominal Pain, Bloating, Change in Bowel Habits, Chronic diarrhea and Constipation. Not Present- Bloody Stool, Difficulty Swallowing, Excessive gas, Gets full quickly at meals, Hemorrhoids, Indigestion, Nausea, Rectal Pain and Vomiting. Female Genitourinary Not Present- Frequency, Nocturia, Painful Urination, Pelvic Pain and Urgency. Musculoskeletal Present- Back Pain. Not Present- Joint Pain, Joint Stiffness, Muscle Pain, Muscle Weakness and Swelling of Extremities. Neurological Present- Headaches. Not Present- Decreased Memory, Fainting, Numbness, Seizures, Tingling, Tremor, Trouble walking and Weakness. Psychiatric Present- Anxiety and Frequent crying. Not Present- Bipolar, Change in Sleep Pattern, Depression and Fearful. Endocrine Not Present- Cold Intolerance, Excessive Hunger, Hair Changes, Heat Intolerance, Hot flashes and New Diabetes. Hematology Not Present- Easy Bruising, Excessive bleeding, Gland problems, HIV and Persistent Infections.  Vitals (Chemira Jones CMA; 08/31/2015 9:32 AM) 08/31/2015 9:31 AM Weight: 209.4 lb  Height: 64.5in Body Surface Area: 2.01 m Body Mass Index: 35.39 kg/m  Temp.: 98.80F(Oral)  Pulse: 66 (Regular)  BP: 118/80 (Sitting, Left Arm, Standard)       Physical Exam (  Lorraine Carl MD; 08/31/2015 10:09 AM) General Mental Status-Alert. General Appearance-Cooperative. Orientation-Oriented X4. Posture-Normal posture.  Integumentary Global Assessment Normal Exam - Head/Face: no rashes, ulcers, lesions or evidence of photo damage. No palpable nodules or masses and Neck: no visible lesions or palpable masses.  Head and Neck Head-normocephalic, atraumatic with no lesions or palpable masses. Face Global Assessment - atraumatic. Thyroid Gland Characteristics - normal size and consistency.  Eye Eyeball - Bilateral-Extraocular movements intact. Sclera/Conjunctiva - Bilateral-No scleral icterus, No Discharge.  ENMT Nose and Sinuses Nose - no deformities observed, no swelling present.  Chest and Lung Exam Palpation Normal exam - Non-tender. Auscultation Breath sounds - Normal.  Cardiovascular Auscultation Rhythm - Regular. Heart Sounds - S1 WNL and S2 WNL. Carotid arteries - No Carotid bruit.  Abdomen Inspection Inspection of the abdomen reveals - No Visible peristalsis, No Abnormal pulsations and No Paradoxical movements. Note: 2-3 cm defect in the just superior to the umbilicus. Palpation/Percussion Normal exam - Soft, Non Tender, No Rebound tenderness, No Rigidity (guarding), No hepatosplenomegaly and No Palpable abdominal masses.  Peripheral Vascular Upper Extremity Palpation - Pulses bilaterally normal. Lower Extremity Palpation - Edema - Bilateral - No edema.  Neurologic Neurologic evaluation reveals -normal sensation and normal coordination.  Neuropsychiatric Mental status exam performed with findings of-able to articulate well with normal speech/language, rate, volume and coherence and thought content normal with  ability to perform basic computations and apply abstract reasoning.  Musculoskeletal Normal Exam - Bilateral-Upper Extremity Strength Normal and Lower Extremity Strength Normal.    Assessment & Plan Lorraine Carl MD; 08/31/2015 9:56 AM) UMBILICAL HERNIA WITHOUT OBSTRUCTION AND WITHOUT GANGRENE (K42.9) Impression: 24yo female with symptomatic hernia for 2 years, symptoms becoming more severe and frequent. The etiology of hernias was discussed, that they are holes in the abdominal wall, that they generally get larger not smaller, that they do not go away without surgical treatement, that they rarely can cause obstruction/strangulation but are more commonly fixed for symptoms and pain. We discussed options for repair including open vs laparoscopic hernia repair with mesh. We discussed specifics of the repair that we would utilize mesh to decrease recurrence, that the risks include infection, bleeding, injury to intraabdominal organs, and recurrence. We discussed that she would be sore for 1 month and have some soreness with activity for up to 6 months. She showed good understanding and wanted to proceed with laparoscopic hernia repair with mesh. Current Plans Pt Education - Pamphlet Given - Hernia Surgery: discussed with patient and provided information. The anatomy & physiology of the abdominal wall was discussed. The pathophysiology of hernias was discussed. Natural history risks without surgery including progeressive enlargement, pain, incarceration, & strangulation was discussed. Contributors to complications such as smoking, obesity, diabetes, prior surgery, etc were discussed.  I feel the risks of no intervention will lead to serious problems that outweigh the operative risks; therefore, I recommended surgery to reduce and repair the hernia. I explained laparoscopic techniques with possible need for an open approach. I noted the probable use of mesh to patch and/or buttress the hernia  repair  Risks such as bleeding, infection, abscess, need for further treatment, heart attack, death, and other risks were discussed. I noted a good likelihood this will help address the problem. Goals of post-operative recovery were discussed as well. Possibility that this will not correct all symptoms was explained. I stressed the importance of low-impact activity, aggressive pain control, avoiding constipation, & not pushing through pain to minimize risk of post-operative chronic  pain or injury. Possibility of reherniation especially with smoking, obesity, diabetes, immunosuppression, and other health conditions was discussed. We will work to minimize complications.  An educational handout further explaining the pathology & treatment options was given as well. Questions were answered. The patient expresses understanding & wishes to proceed with surgery.  You are being scheduled for surgery - Our schedulers will call you.  You should hear from our office's scheduling department within 5 working days about the location, date, and time of surgery. We try to make accommodations for patient's preferences in scheduling surgery, but sometimes the OR schedule or the surgeon's schedule prevents us from making those accommodations.  If you have not heard from our office [phone]) in 5 working days, call the office and ask for your surgeon's nurse.  If you have other questions about your diagnosis, plan, or surgery, call the office and ask for your surgeon's nurse.  CHRONIC CONSTIPATION (K59.09) Impression: We discussed the benefits of treating your constipation with fiber rich foods and supplements. -She was given a handout with specific foods and encouraged to used metamucil/psyllium powder 1 tbsp BID with meals.

## 2015-10-14 ENCOUNTER — Ambulatory Visit (HOSPITAL_COMMUNITY)
Admission: EM | Admit: 2015-10-14 | Discharge: 2015-10-14 | Disposition: A | Discharge status: Home / Self Care | Payer: Medicaid Other | Attending: Emergency Medicine | Admitting: Emergency Medicine

## 2015-10-14 ENCOUNTER — Encounter (HOSPITAL_COMMUNITY): Payer: Self-pay | Admitting: *Deleted

## 2015-10-14 DIAGNOSIS — M62838 Other muscle spasm: Secondary | ICD-10-CM

## 2015-10-14 MED ORDER — TRIAMCINOLONE ACETONIDE 40 MG/ML IJ SUSP
INTRAMUSCULAR | Status: AC
  Filled 2015-10-14: qty 1

## 2015-10-14 MED ORDER — CYCLOBENZAPRINE HCL 10 MG PO TABS: 10.0000 mg | ORAL_TABLET | Freq: Two times a day (BID) | ORAL | Status: DC | PRN

## 2015-10-14 MED ORDER — LIDOCAINE HCL (PF) 1 % IJ SOLN
INTRAMUSCULAR | Status: AC
  Filled 2015-10-14: qty 5

## 2015-10-14 NOTE — Discharge Instructions (Signed)

## 2015-10-14 NOTE — ED Provider Notes (Signed)
CSN: [id number]     Arrival date & time 10/14/15  1908 History   None    Chief Complaint  Patient presents with  . Back Pain   (Consider location/radiation/quality/duration/timing/severity/associated sxs/prior Treatment)  HPI   The patient is a 25 year old female presenting today with complaints of right back/shoulder pain that has been "ongoing" for approximately 2 years. Patient states she saw an urgent care several months back and was told to follow-up with her primary care provider after being given some anti-inflammatories and muscle relaxers. Denies significant medical history, allergies or medications.  Past Medical History  Diagnosis Date  . History of kidney stones   . Hx of pyelonephritis    Past Surgical History  Procedure Laterality Date  . Cesarean section N/A 11/18/2013    Procedure: CESAREAN SECTION;  Surgeon: Levi AlandMark E Anderson, MD;  Location: [facility];  Service: Obstetrics;  Laterality: N/A;   Family History  Problem Relation Age of Onset  . Arthritis Mother   . Hypertension Mother   . Birth defects Neg Hx   . Cancer Neg Hx   . COPD Neg Hx   . Asthma Neg Hx   . Alcohol abuse Neg Hx   . Depression Neg Hx   . Diabetes Neg Hx   . Drug abuse Neg Hx   . Early death Neg Hx   . Hearing loss Neg Hx   . Heart disease Neg Hx   . Hyperlipidemia Neg Hx   . Learning disabilities Neg Hx   . Kidney disease Neg Hx   . Mental illness Neg Hx   . Mental retardation Neg Hx   . Miscarriages / Stillbirths Neg Hx   . Stroke Neg Hx   . Vision loss Neg Hx   . Varicose Veins Neg Hx    Social History  Substance Use Topics  . Smoking status: Never Smoker   . Smokeless tobacco: Never Used  . Alcohol Use: No   OB History    Gravida Para Term Preterm AB TAB SAB Ectopic Multiple Living   2 2 2       2      Review of Systems  Constitutional: Negative.  Negative for fever and fatigue.  HENT: Negative.   Eyes: Negative.  Negative for visual disturbance.  Respiratory: Negative.   Negative for cough and shortness of breath.   Cardiovascular: Negative.  Negative for chest pain and leg swelling.  Gastrointestinal: Negative.   Endocrine: Negative.   Genitourinary: Negative.   Musculoskeletal: Positive for back pain. Negative for myalgias, joint swelling, gait problem, neck pain and neck stiffness.  Skin: Negative.  Negative for rash.  Allergic/Immunologic: Negative.   Neurological: Negative.  Negative for dizziness and headaches.  Hematological: Negative.   Psychiatric/Behavioral: Negative.      Allergies  Review of patient's allergies indicates no known allergies.  Home Medications   Prior to Admission medications   Medication Sig Start Date End Date Taking? Authorizing Provider  Norelgestromin-Eth Estradiol Burr Medico(XULANE TD) Place onto the skin.   Yes Historical Provider, MD  cyclobenzaprine (FLEXERIL) 10 MG tablet Take 1 tablet (10 mg total) by mouth 2 (two) times daily as needed for muscle spasms. 10/14/15   Servando Salinaatherine H Neriyah Cercone, NP  docusate sodium (COLACE) 100 MG capsule Take 1 capsule (100 mg total) by mouth 2 (two) times daily. 11/20/13   Waynard ReedsKendra Ross, MD  ibuprofen (ADVIL,MOTRIN) 600 MG tablet Take 1 tablet (600 mg total) by mouth every 6 (six) hours as needed. 11/20/13  Waynard Reeds, MD  oxyCODONE-acetaminophen (ROXICET) 5-325 MG per tablet Take 1-2 tablets by mouth every 4 (four) hours as needed for severe pain. 11/20/13   Waynard Reeds, MD   Meds Ordered and Administered this Visit  Medications - No data to display  BP 131/83 mmHg  Pulse 104  Temp(Src) 98.1 F (36.7 C) (Oral)  Resp 18  SpO2 100%  LMP 09/19/2015  Breastfeeding? No No data found.   Physical Exam  Constitutional: She is oriented to person, place, and time. She appears well-developed and well-nourished. No distress.  Eyes: Conjunctivae are normal. Pupils are equal, round, and reactive to light. Right eye exhibits no discharge. Left eye exhibits no discharge. No scleral icterus.  Neck: Normal range  of motion. Neck supple.  Negative for nuchal rigidity.  Cardiovascular: Normal rate, regular rhythm, normal heart sounds and intact distal pulses.  Exam reveals no gallop and no friction rub.   No murmur heard. Pulmonary/Chest: Effort normal and breath sounds normal. No respiratory distress. She has no wheezes. She has no rales. She exhibits no tenderness.  Musculoskeletal:  The patient is able to ambulate and step up and down from examination table without assistance. She is in no acute or severe distress. There is no evidence of surface trauma; or breaks in skin, abrasions or bruising. No step-off or deformity of the bony cervical, thoracic, or LS spine to firm palpation at midline. Negative for CVA tenderness. Able to stand erect, normal flexion, extension, lateral bending and rotation without limitation, but complains of pain. Muscle spasm noted on R subscapularis.  Tender with palpation.    Neurological: She is alert and oriented to person, place, and time.  Skin: Skin is warm and dry. She is not diaphoretic.  Nursing note and vitals reviewed.   ED Course  Injection tendon or ligament Date/Time: 10/14/2015 8:19 PM Performed by: Servando Salina Authorized by: Servando Salina Consent: Verbal consent obtained. Risks and benefits: risks, benefits and alternatives were discussed Consent given by: patient Patient identity confirmed: verbally with patient and arm band Patient tolerance: Patient tolerated the procedure well with no immediate complications Comments: Kenalog and 1% lidocaine injected in 2 locations of R subscapularis.  0.5 ml injected total.     Labs Review Labs Reviewed - No data to display  Imaging Review No results found.    MDM   1. Muscle spasm of right shoulder    Meds ordered this encounter  Medications  . Norelgestromin-Eth Estradiol Burr Medico TD)    Sig: Place onto the skin.  . cyclobenzaprine (FLEXERIL) 10 MG tablet    Sig: Take 1 tablet (10 mg  total) by mouth 2 (two) times daily as needed for muscle spasms.    Dispense:  20 tablet    Refill:  0   The patient verbalizes understanding and agrees to plan of care.      Servando Salina, NP 10/14/15 2021

## 2015-10-14 NOTE — ED Notes (Signed)
Assessment per C. Rossi, NP. 

## 2015-11-08 ENCOUNTER — Encounter (HOSPITAL_BASED_OUTPATIENT_CLINIC_OR_DEPARTMENT_OTHER): Payer: Self-pay | Admitting: *Deleted

## 2015-11-15 ENCOUNTER — Encounter (HOSPITAL_BASED_OUTPATIENT_CLINIC_OR_DEPARTMENT_OTHER): Admission: RE | Payer: Self-pay | Source: Ambulatory Visit

## 2015-11-15 ENCOUNTER — Ambulatory Visit (HOSPITAL_BASED_OUTPATIENT_CLINIC_OR_DEPARTMENT_OTHER): Admission: RE | Admit: 2015-11-15 | Payer: Medicaid Other | Source: Ambulatory Visit | Admitting: General Surgery

## 2015-11-15 HISTORY — DX: Umbilical hernia without obstruction or gangrene: K42.9

## 2015-11-15 SURGERY — REPAIR, HERNIA, UMBILICAL, LAPAROSCOPIC
Anesthesia: General

## 2015-12-05 ENCOUNTER — Emergency Department (HOSPITAL_COMMUNITY)
Admission: EM | Admit: 2015-12-05 | Discharge: 2015-12-05 | Disposition: A | Discharge status: Home / Self Care | Payer: Medicaid Other | Attending: Emergency Medicine | Admitting: Emergency Medicine

## 2015-12-05 ENCOUNTER — Encounter (HOSPITAL_COMMUNITY): Payer: Self-pay | Admitting: Emergency Medicine

## 2015-12-05 DIAGNOSIS — Z79818 Long term (current) use of other agents affecting estrogen receptors and estrogen levels: Secondary | ICD-10-CM | POA: Insufficient documentation

## 2015-12-05 DIAGNOSIS — N3000 Acute cystitis without hematuria: Secondary | ICD-10-CM | POA: Insufficient documentation

## 2015-12-05 LAB — URINALYSIS, ROUTINE W REFLEX MICROSCOPIC
Bilirubin Urine: NEGATIVE
Glucose, UA: NEGATIVE mg/dL
Hgb urine dipstick: NEGATIVE
KETONES UR: NEGATIVE mg/dL
Nitrite: NEGATIVE
PH: 6.5 (ref 5.0–8.0)
Protein, ur: NEGATIVE mg/dL
Specific Gravity, Urine: 1.018 (ref 1.005–1.030)

## 2015-12-05 LAB — CBC
HEMATOCRIT: 37.4 % (ref 36.0–46.0)
Hemoglobin: 12 g/dL (ref 12.0–15.0)
MCH: 28.6 pg (ref 26.0–34.0)
MCHC: 32.1 g/dL (ref 30.0–36.0)
MCV: 89 fL (ref 78.0–100.0)
Platelets: 358 10*3/uL (ref 150–400)
RBC: 4.2 MIL/uL (ref 3.87–5.11)
RDW: 12.5 % (ref 11.5–15.5)
WBC: 7.4 10*3/uL (ref 4.0–10.5)

## 2015-12-05 LAB — COMPREHENSIVE METABOLIC PANEL
ALBUMIN: 3.7 g/dL (ref 3.5–5.0)
ALT: 15 U/L (ref 14–54)
AST: 16 U/L (ref 15–41)
Alkaline Phosphatase: 65 U/L (ref 38–126)
Anion gap: 5 (ref 5–15)
BUN: 7 mg/dL (ref 6–20)
CHLORIDE: 109 mmol/L (ref 101–111)
CO2: 25 mmol/L (ref 22–32)
Calcium: 8.9 mg/dL (ref 8.9–10.3)
Creatinine, Ser: 0.69 mg/dL (ref 0.44–1.00)
GFR calc Af Amer: >60 mL/min (ref >60)
Glucose, Bld: 93 mg/dL (ref 65–99)
POTASSIUM: 4 mmol/L (ref 3.5–5.1)
SODIUM: 139 mmol/L (ref 135–145)
Total Bilirubin: 0.5 mg/dL (ref 0.3–1.2)
Total Protein: 7.4 g/dL (ref 6.5–8.1)

## 2015-12-05 LAB — URINE MICROSCOPIC-ADD ON

## 2015-12-05 LAB — PREGNANCY, URINE: Preg Test, Ur: NEGATIVE

## 2015-12-05 LAB — LIPASE, BLOOD: LIPASE: 20 U/L (ref 11–51)

## 2015-12-05 MED ORDER — CEPHALEXIN 500 MG PO CAPS: 500.0000 mg | ORAL_CAPSULE | Freq: Two times a day (BID) | ORAL | Status: DC

## 2015-12-05 NOTE — Discharge Instructions (Signed)
Take antibiotics as prescribed. Return without fail for worsening symptoms, including fever, intractable vomiting, flank pain, severe abdominal pain or any other symptoms concerning to you.  Urinary Tract Infection Urinary tract infections (UTIs) can develop anywhere along your urinary tract. Your urinary tract is your body's drainage system for removing wastes and extra water. Your urinary tract includes two kidneys, two ureters, a bladder, and a urethra. Your kidneys are a pair of bean-shaped organs. Each kidney is about the size of your fist. They are located below your ribs, one on each side of your spine. CAUSES Infections are caused by microbes, which are microscopic organisms, including fungi, viruses, and bacteria. These organisms are so small that they can only be seen through a microscope. Bacteria are the microbes that most commonly cause UTIs. SYMPTOMS  Symptoms of UTIs may vary by age and gender of the patient and by the location of the infection. Symptoms in young women typically include a frequent and intense urge to urinate and a painful, burning feeling in the bladder or urethra during urination. Older women and men are more likely to be tired, shaky, and weak and have muscle aches and abdominal pain. A fever may mean the infection is in your kidneys. Other symptoms of a kidney infection include pain in your back or sides below the ribs, nausea, and vomiting. DIAGNOSIS To diagnose a UTI, your caregiver will ask you about your symptoms. Your caregiver will also ask you to provide a urine sample. The urine sample will be tested for bacteria and white blood cells. White blood cells are made by your body to help fight infection. TREATMENT  Typically, UTIs can be treated with medication. Because most UTIs are caused by a bacterial infection, they usually can be treated with the use of antibiotics. The choice of antibiotic and length of treatment depend on your symptoms and the type of bacteria  causing your infection. HOME CARE INSTRUCTIONS  If you were prescribed antibiotics, take them exactly as your caregiver instructs you. Finish the medication even if you feel better after you have only taken some of the medication.  Drink enough water and fluids to keep your urine clear or pale yellow.  Avoid caffeine, tea, and carbonated beverages. They tend to irritate your bladder.  Empty your bladder often. Avoid holding urine for long periods of time.  Empty your bladder before and after sexual intercourse.  After a bowel movement, women should cleanse from front to back. Use each tissue only once. SEEK MEDICAL CARE IF:   You have back pain.  You develop a fever.  Your symptoms do not begin to resolve within 3 days. SEEK IMMEDIATE MEDICAL CARE IF:   You have severe back pain or lower abdominal pain.  You develop chills.  You have nausea or vomiting.  You have continued burning or discomfort with urination. MAKE SURE YOU:   Understand these instructions.  Will watch your condition.  Will get help right away if you are not doing well or get worse.   This information is not intended to replace advice given to you by your health care provider. Make sure you discuss any questions you have with your health care provider.   Document Released: 02/13/2005 Document Revised: 01/25/2015 Document Reviewed: 06/14/2011 Elsevier Interactive Patient Education [employer]

## 2015-12-05 NOTE — ED Notes (Signed)
MD at bedside. 

## 2015-12-05 NOTE — ED Notes (Signed)
Went to call pt to bring back to exam room, but no patients in the lobby

## 2015-12-05 NOTE — ED Notes (Signed)
Patient presents for abdominal pain, urinary frequency, urinary urgency x2 days. Denies fever, N/V/D, hematuria or vaginal discharge.

## 2015-12-05 NOTE — ED Notes (Signed)
Patient was alert, oriented and stable upon discharge. RN went over AVS and patient had no further questions.  

## 2015-12-05 NOTE — ED Provider Notes (Signed)
CSN: [id number]     Arrival date & time 12/05/15  1657 History   First MD Initiated Contact with Patient 12/05/15 1743     Chief Complaint  Patient presents with  . Abdominal Pain  . Urinary Frequency     (Consider location/radiation/quality/duration/timing/severity/associated sxs/prior Treatment) HPI 25 year old female with history of kidney stones and recurrent UTIs who presents with 2 days of urinary frequency and urinary urgency. States that this feels similar to prior presentations of UTI. Has had intermittent upper abdominal pain with some intermittent nausea. No vomiting, fevers or chills, night sweats, flank pain, vaginal bleeding, vaginal discharge. Past Medical History  Diagnosis Date  . History of kidney stones   . Hx of pyelonephritis   . Umbilical hernia    Past Surgical History  Procedure Laterality Date  . Cesarean section N/A 11/18/2013    Procedure: CESAREAN SECTION;  Surgeon: Levi AlandMark E Anderson, MD;  Location: [facility];  Service: Obstetrics;  Laterality: N/A;   Family History  Problem Relation Age of Onset  . Arthritis Mother   . Hypertension Mother   . Birth defects Neg Hx   . Cancer Neg Hx   . COPD Neg Hx   . Asthma Neg Hx   . Alcohol abuse Neg Hx   . Depression Neg Hx   . Diabetes Neg Hx   . Drug abuse Neg Hx   . Early death Neg Hx   . Hearing loss Neg Hx   . Heart disease Neg Hx   . Hyperlipidemia Neg Hx   . Learning disabilities Neg Hx   . Kidney disease Neg Hx   . Mental illness Neg Hx   . Mental retardation Neg Hx   . Miscarriages / Stillbirths Neg Hx   . Stroke Neg Hx   . Vision loss Neg Hx   . Varicose Veins Neg Hx    Social History  Substance Use Topics  . Smoking status: Never Smoker   . Smokeless tobacco: Never Used  . Alcohol Use: No   OB History    Gravida Para Term Preterm AB TAB SAB Ectopic Multiple Living   2 2 2       2      Review of Systems 10/14 systems reviewed and are negative other than those stated in the  HPI   Allergies  Review of patient's allergies indicates no known allergies.  Home Medications   Prior to Admission medications   Medication Sig Start Date End Date Taking? Authorizing Provider  norgestimate-ethinyl estradiol (ORTHO-CYCLEN,SPRINTEC,PREVIFEM) 0.25-35 MG-MCG tablet Take 1 tablet by mouth daily.   Yes Historical Provider, MD  cephALEXin (KEFLEX) 500 MG capsule Take 1 capsule (500 mg total) by mouth 2 (two) times daily. 12/05/15   Lavera Guiseana Duo Liu, MD  cyclobenzaprine (FLEXERIL) 10 MG tablet Take 1 tablet (10 mg total) by mouth 2 (two) times daily as needed for muscle spasms. Patient not taking: Reported on 12/05/2015 10/14/15   Servando Salinaatherine H Rossi, NP  ibuprofen (ADVIL,MOTRIN) 600 MG tablet Take 1 tablet (600 mg total) by mouth every 6 (six) hours as needed. Patient not taking: Reported on 12/05/2015 11/20/13   Waynard ReedsKendra Ross, MD   BP 114/65 mmHg  Pulse 85  Temp(Src) 98 F (36.7 C) (Oral)  Resp 20  SpO2 100%  LMP 11/20/2015 Physical Exam Physical Exam  Nursing note and vitals reviewed. Constitutional: Well developed, well nourished, non-toxic, and in no acute distress Head: Normocephalic and atraumatic.  Mouth/Throat: Oropharynx is clear and moist.  Neck: Normal range  of motion. Neck supple.  Cardiovascular: Normal rate and regular rhythm.   Pulmonary/Chest: Effort normal and breath sounds normal.  Abdominal: Soft. There is no tenderness. There is no rebound and no guarding. No CVA tenderness. Musculoskeletal: Normal range of motion.  Neurological: Alert, no facial droop, fluent speech, moves all extremities symmetrically Skin: Skin is warm and dry.  Psychiatric: Cooperative  ED Course  Procedures (including critical care time) Labs Review Labs Reviewed  URINALYSIS, ROUTINE W REFLEX MICROSCOPIC (NOT AT [facility]) - Abnormal; Notable for the following:    APPearance CLOUDY (*)    Leukocytes, UA LARGE (*)    All other components within normal limits  URINE MICROSCOPIC-ADD ON -  Abnormal; Notable for the following:    Squamous Epithelial / LPF 6-30 (*)    Bacteria, UA MANY (*)    All other components within normal limits  URINE CULTURE  LIPASE, BLOOD  COMPREHENSIVE METABOLIC PANEL  CBC  PREGNANCY, URINE    Imaging Review No results found. I have personally reviewed and evaluated these images and lab results as part of my medical decision-making.   EKG Interpretation None      MDM   Final diagnoses:  Acute cystitis without hematuria   25 year old female who presents with 2 days of urinary frequency and urgency. Well-appearing in no acute distress. Vital signs within normal limits. Soft and benign abdomen without CVA tenderness. Does not appear systemically ill. No leukocytosis and normal renal function. Urinalysis with evidence of urinary tract infection and sent for culture. She is not pregnant. I will treat with course of Keflex. Strict return and follow-up instructions reviewed. She expressed understanding of all discharge instructions and felt comfortable with the plan of care.    Lavera Guise, MD 12/05/15 [phone]

## 2015-12-07 LAB — URINE CULTURE

## 2015-12-08 ENCOUNTER — Telehealth: Payer: Self-pay | Admitting: *Deleted

## 2015-12-08 NOTE — Telephone Encounter (Signed)
Post ED Visit - Positive Culture Follow-up  Culture report reviewed by antimicrobial stewardship pharmacist:  []  Enzo BiNathan Batchelder, Pharm.D. []  Celedonio MiyamotoJeremy Frens, Pharm.D., BCPS []  Garvin FilaMike Maccia, Pharm.D. []  Georgina PillionElizabeth Martin, Pharm.D., BCPS [x]  RayMinh Pham, [place]arm.D., BCPS, AAHIVP []  Estella HuskMichelle Turner, Pharm.D., BCPS, AAHIVP []  Tennis Mustassie Stewart, Pharm.D. []  Rob Oswaldo DoneVincent, [address]Pharm.D[address]  Positive urine culture Treated with Cephalexin, organism sensitive to the same and no further patient follow-up is required at this time.  Virl AxeRobertson, Joyanna Kleman [facility] 12/08/2015, 1:36 PM

## 2015-12-19 ENCOUNTER — Emergency Department (HOSPITAL_COMMUNITY)
Admission: EM | Admit: 2015-12-19 | Discharge: 2015-12-19 | Disposition: A | Discharge status: Home / Self Care | Payer: Medicaid Other | Attending: Emergency Medicine | Admitting: Emergency Medicine

## 2015-12-19 ENCOUNTER — Encounter (HOSPITAL_COMMUNITY): Payer: Self-pay | Admitting: *Deleted

## 2015-12-19 DIAGNOSIS — K0889 Other specified disorders of teeth and supporting structures: Secondary | ICD-10-CM | POA: Insufficient documentation

## 2015-12-19 DIAGNOSIS — Z792 Long term (current) use of antibiotics: Secondary | ICD-10-CM | POA: Insufficient documentation

## 2015-12-19 MED ORDER — PENICILLIN V POTASSIUM 500 MG PO TABS: 500.0000 mg | ORAL_TABLET | Freq: Four times a day (QID) | ORAL | 0 refills | Status: AC

## 2015-12-19 MED ORDER — HYDROCODONE-ACETAMINOPHEN 5-325 MG PO TABS: 1.0000 | ORAL_TABLET | Freq: Four times a day (QID) | ORAL | 0 refills | Status: DC | PRN

## 2015-12-19 NOTE — Discharge Instructions (Signed)
Please follow up with a Dentist for further evaluation and management.   °Check this website for free, low-income or sliding scale dental services in [place]. www.freedental.us   °To find a dentist in the [place] or surrounding areas check this website: http://www.ncdental.org/for-the-public/find-[email or web address]-dentist ° °

## 2015-12-19 NOTE — ED Triage Notes (Signed)
Pt complains of right sided jaw pain for the past 3 days. Pt states her teeth feel more sensitive than usual. Pt is unsure if she has cavity or broken tooth.

## 2015-12-19 NOTE — ED Provider Notes (Signed)
[facility]-EMER[facility]ENCY DEPT Provider Note   CSN: [id number] Arrival date & time: 12/19/15  1703  First Provider Contact:  First MD Initiated Contact with Patient 12/19/15 1734     By signing my name below, I, Majel Homer, attest that this documentation has been prepared under the direction and in the presence of non-physician practitioner, Cheri Fowler, PA-C. Electronically Signed: Majel Homer, Scribe. 12/19/2015. 6:11 PM.  History   Chief Complaint Chief Complaint  Patient presents with  . Dental Pain   The history is provided by the patient. No language interpreter was used.   HPI Comments: Lorraine Espinoza is a 25 y.o. female who presents to the Emergency Department complaining of gradually worsening, right upper dental pain that began 3 days ago and worsened today. She reports her pain feels as if her gums are tightening and her teeth are shifting. She states her pain is exacerbated when drinking cold liquids and eating hot foods. She notes she has taken naproxen, ibuprofen and orajel with mild relief. She denies fever, nausea, vomiting, tongue swelling, bleeding from her gums and difficulty swallowing.   Past Medical History:  Diagnosis Date  . History of kidney stones   . Hx of pyelonephritis   . Umbilical hernia    Patient Active Problem List   Diagnosis Date Noted  . Supervision of other normal pregnancy 11/18/2013  . Normal labor 11/16/2013  . CYSTITIS, ACUTE 03/18/2007  . GROSS HEMATURIA 03/18/2007  . SYMPTOM, HEADACHE 02/04/2007    Past Surgical History:  Procedure Laterality Date  . CESAREAN SECTION N/A 11/18/2013   Procedure: CESAREAN SECTION;  Surgeon: Levi Aland, MD;  Location: [facility];  Service: Obstetrics;  Laterality: N/A;    OB History    Gravida Para Term Preterm AB Living   2 2 2     2    SAB TAB Ectopic Multiple Live Births           2     Home Medications    Prior to Admission medications   Medication Sig Start Date End Date Taking? Authorizing Provider   cephALEXin (KEFLEX) 500 MG capsule Take 1 capsule (500 mg total) by mouth 2 (two) times daily. 12/05/15   Lavera Guise, MD  cyclobenzaprine (FLEXERIL) 10 MG tablet Take 1 tablet (10 mg total) by mouth 2 (two) times daily as needed for muscle spasms. Patient not taking: Reported on 12/05/2015 10/14/15   Servando Salina, NP  HYDROcodone-acetaminophen (NORCO/VICODIN) 5-325 MG tablet Take 1 tablet by mouth every 6 (six) hours as needed. 12/19/15   Cheri Fowler, PA-C  ibuprofen (ADVIL,MOTRIN) 600 MG tablet Take 1 tablet (600 mg total) by mouth every 6 (six) hours as needed. Patient not taking: Reported on 12/05/2015 11/20/13   Waynard Reeds, MD  norgestimate-ethinyl estradiol (ORTHO-CYCLEN,SPRINTEC,PREVIFEM) 0.25-35 MG-MCG tablet Take 1 tablet by mouth daily.    Historical Provider, MD  penicillin v potassium (VEETID) 500 MG tablet Take 1 tablet (500 mg total) by mouth 4 (four) times daily. 12/19/15 12/26/15  Cheri Fowler, PA-C   Family History Family History  Problem Relation Age of Onset  . Arthritis Mother   . Hypertension Mother   . Birth defects Neg Hx   . Cancer Neg Hx   . COPD Neg Hx   . Asthma Neg Hx   . Alcohol abuse Neg Hx   . Depression Neg Hx   . Diabetes Neg Hx   . Drug abuse Neg Hx   . Early death Neg Hx   .  Hearing loss Neg Hx   . Heart disease Neg Hx   . Hyperlipidemia Neg Hx   . Learning disabilities Neg Hx   . Kidney disease Neg Hx   . Mental illness Neg Hx   . Mental retardation Neg Hx   . Miscarriages / Stillbirths Neg Hx   . Stroke Neg Hx   . Vision loss Neg Hx   . Varicose Veins Neg Hx    Social History Social History  Substance Use Topics  . Smoking status: Never Smoker  . Smokeless tobacco: Never Used  . Alcohol use No   Allergies   Review of patient's allergies indicates no known allergies.  Review of Systems Review of Systems  Constitutional: Negative for fever.  HENT: Positive for dental problem. Negative for facial swelling and trouble swallowing.     Gastrointestinal: Negative for nausea and vomiting.  All other systems reviewed and are negative.  Physical Exam Updated Vital Signs BP 114/76 (BP Location: Left Arm)   Pulse 92   Temp 97.8 F (36.6 C) (Oral)   Resp 18   LMP 12/11/2015   SpO2 98%   Physical Exam  Constitutional: She is oriented to person, place, and time. She appears well-developed and well-nourished.  HENT:  Head: Normocephalic and atraumatic.  Mouth/Throat: Uvula is midline, oropharynx is clear and moist and mucous membranes are normal. No oral lesions. No trismus in the jaw. No dental abscesses.  No tongue swelling or facial swelling.  Possibly mild gingival irritation along right upper bicuspids and molars. Airway patent. Patient tolerating secretions without difficulty.   Eyes: Conjunctivae are normal.  Neck: Normal range of motion. Neck supple.  No evidence of Ludwigs angina.  Cardiovascular: Normal rate, regular rhythm and normal heart sounds.   Pulmonary/Chest: Effort normal and breath sounds normal.  Abdominal: Bowel sounds are normal. She exhibits no distension.  Musculoskeletal:  Moves all extremities spontaneously.  Lymphadenopathy:    She has no cervical adenopathy.  Neurological: She is alert and oriented to person, place, and time.  Speech clear without dysarthria.   Skin: Skin is warm and dry.   ED Treatments / Results  Labs (all labs ordered are listed, but only abnormal results are displayed) Labs Reviewed - No data to display  EKG  EKG Interpretation None      Radiology No results found.  Procedures Procedures  DIAGNOSTIC STUDIES:  Oxygen Saturation is 98% on RA, normal by my interpretation.    COORDINATION OF CARE:  6:11 PM Discussed treatment plan with pt at bedside and pt agreed to plan.  Medications Ordered in ED Medications - No data to display  Initial Impression / Assessment and Plan / ED Course  I have reviewed the triage vital signs and the nursing  notes.  Pertinent labs & imaging results that were available during my care of the patient were reviewed by me and considered in my medical decision making (see chart for details).  Clinical Course   Suspect dental pain associated with gingival irritation, and possible dental infection with patient afebrile, non toxic appearing and swallowing secretions well. Doubt Ludwig angina or deep soft tissue infection.  I gave patient referral to dentist and stressed the importance of dental follow up for ultimate management of dental pain. Discussed return precautions.  Patient expresses understanding and agrees with plan.  I will also give penicillin VK and pain control.    Final Clinical Impressions(s) / ED Diagnoses   Final diagnoses:  Pain, dental    New  Prescriptions New Prescriptions   HYDROCODONE-ACETAMINOPHEN (NORCO/VICODIN) 5-325 MG TABLET    Take 1 tablet by mouth every 6 (six) hours as needed.   PENICILLIN V POTASSIUM (VEETID) 500 MG TABLET    Take 1 tablet (500 mg total) by mouth 4 (four) times daily.   I personally performed the services described in this documentation, which was scribed in my presence. The recorded information has been reviewed and is accurate.     Cheri Fowler, PA-C 12/19/15 1832    Rolan Bucco, MD 12/19/15 [phone]

## 2016-09-24 ENCOUNTER — Emergency Department (HOSPITAL_COMMUNITY)
Admission: EM | Admit: 2016-09-24 | Discharge: 2016-09-24 | Disposition: A | Discharge status: Home / Self Care | Payer: Medicaid Other | Attending: Emergency Medicine | Admitting: Emergency Medicine

## 2016-09-24 ENCOUNTER — Emergency Department (HOSPITAL_COMMUNITY): Payer: Medicaid Other

## 2016-09-24 ENCOUNTER — Encounter (HOSPITAL_COMMUNITY): Payer: Self-pay | Admitting: Emergency Medicine

## 2016-09-24 DIAGNOSIS — R0602 Shortness of breath: Secondary | ICD-10-CM | POA: Insufficient documentation

## 2016-09-24 DIAGNOSIS — F419 Anxiety disorder, unspecified: Secondary | ICD-10-CM | POA: Insufficient documentation

## 2016-09-24 LAB — COMPREHENSIVE METABOLIC PANEL
ALT: 13 U/L — ABNORMAL LOW (ref 14–54)
AST: 19 U/L (ref 15–41)
Albumin: 4.4 g/dL (ref 3.5–5.0)
Alkaline Phosphatase: 77 U/L (ref 38–126)
Anion gap: 13 (ref 5–15)
BILIRUBIN TOTAL: 0.6 mg/dL (ref 0.3–1.2)
BUN: 6 mg/dL (ref 6–20)
CHLORIDE: 105 mmol/L (ref 101–111)
CO2: 20 mmol/L — ABNORMAL LOW (ref 22–32)
Calcium: 9.6 mg/dL (ref 8.9–10.3)
Creatinine, Ser: 0.75 mg/dL (ref 0.44–1.00)
Glucose, Bld: 99 mg/dL (ref 65–99)
POTASSIUM: 3 mmol/L — AB (ref 3.5–5.1)
Sodium: 138 mmol/L (ref 135–145)
TOTAL PROTEIN: 8 g/dL (ref 6.5–8.1)

## 2016-09-24 LAB — CBG MONITORING, ED: GLUCOSE-CAPILLARY: 84 mg/dL (ref 65–99)

## 2016-09-24 LAB — CBC WITH DIFFERENTIAL/PLATELET
BASOS ABS: 0 10*3/uL (ref 0.0–0.1)
Basophils Relative: 0 %
EOS PCT: 2 %
Eosinophils Absolute: 0.1 10*3/uL (ref 0.0–0.7)
HEMATOCRIT: 41.3 % (ref 36.0–46.0)
HEMOGLOBIN: 13.9 g/dL (ref 12.0–15.0)
LYMPHS ABS: 2.9 10*3/uL (ref 0.7–4.0)
LYMPHS PCT: 40 %
MCH: 29.5 pg (ref 26.0–34.0)
MCHC: 33.7 g/dL (ref 30.0–36.0)
MCV: 87.7 fL (ref 78.0–100.0)
Monocytes Absolute: 0.7 10*3/uL (ref 0.1–1.0)
Monocytes Relative: 10 %
NEUTROS ABS: 3.4 10*3/uL (ref 1.7–7.7)
Neutrophils Relative %: 48 %
PLATELETS: 348 10*3/uL (ref 150–400)
RBC: 4.71 MIL/uL (ref 3.87–5.11)
RDW: 12.3 % (ref 11.5–15.5)
WBC: 7.1 10*3/uL (ref 4.0–10.5)

## 2016-09-24 LAB — TSH: TSH: 2.424 u[IU]/mL (ref 0.350–4.500)

## 2016-09-24 MED ORDER — HYDROXYZINE HCL 25 MG PO TABS: 25.0000 mg | ORAL_TABLET | Freq: Four times a day (QID) | ORAL | 0 refills | Status: DC

## 2016-09-24 NOTE — ED Notes (Signed)
Patient states that she is feeling better.  V/S stable.  Patient is eating.

## 2016-09-24 NOTE — ED Notes (Signed)
Patient is alert and oriented x3.  She was given DC instructions and follow up visit instructions.  Patient gave verbal understanding. She was DC ambulatory under her own power to home.  V/S stable.  He was not showing any signs of distress on DC 

## 2016-09-24 NOTE — ED Triage Notes (Signed)
Patient c/o SOB with nonproductive cough x1 week. Denies chest pain and N/V/D. Hx seasonal allergies. Ambulatory to triage. Lung sounds clear.

## 2016-09-24 NOTE — ED Provider Notes (Signed)
[facility] Provider Note   CSN: [id number] Arrival date & time: 09/24/16  1246     History   Chief Complaint Chief Complaint  Patient presents with  . Shortness of Breath    HPI Lorraine Espinoza is a 26 y.o. female who presents with worsening anxiety and shortness of breath. Past medical history significant for history of anxiety which is currently untreated. She states that over the past week she has had several episodes of shortness of breath. It is intermittent and nothing makes it better or worse. She is tried ITT IndustriesFlonase and albuterol for her congestion and asthma however this has not helped her shortness of breath. She has had worsening anxiety over the past several months as well. She reports decreased appetite, 30 pound weight loss in the past 6 months, loose stools, numbness and tingling in her fingertips, palpitations. She does not currently have a PCP. She denies any significant stressors in her life at this time however [place]. She denies fever, cough, wheezing, chest pain, leg swelling, abdominal pain, nausea, vomiting, diarrhea, dysuria. She is currently on birth control.   HPI  Past Medical History:  Diagnosis Date  . History of kidney stones   . Hx of pyelonephritis   . Umbilical hernia     Patient Active Problem List   Diagnosis Date Noted  . Supervision of other normal pregnancy 11/18/2013  . Normal labor 11/16/2013  . CYSTITIS, ACUTE 03/18/2007  . GROSS HEMATURIA 03/18/2007  . SYMPTOM, HEADACHE 02/04/2007    Past Surgical History:  Procedure Laterality Date  . CESAREAN SECTION N/A 11/18/2013   Procedure: CESAREAN SECTION;  Surgeon: Levi AlandMark E Anderson, MD;  Location: [facility];  Service: Obstetrics;  Laterality: N/A;    OB History    Gravida Para Term Preterm AB Living   2 2 2     2    SAB TAB Ectopic Multiple Live Births           2       Home Medications    Prior to Admission medications   Medication Sig  Start Date End Date Taking? Authorizing Provider  norgestimate-ethinyl estradiol (ORTHO-CYCLEN,SPRINTEC,PREVIFEM) 0.25-35 MG-MCG tablet Take 1 tablet by mouth daily.   Yes [provider]  cephALEXin (KEFLEX) 500 MG capsule Take 1 capsule (500 mg total) by mouth 2 (two) times daily. Patient not taking: Reported on 09/24/2016 12/05/15   Lavera GuiseLiu, Dana Duo, MD  cyclobenzaprine (FLEXERIL) 10 MG tablet Take 1 tablet (10 mg total) by mouth 2 (two) times daily as needed for muscle spasms. Patient not taking: Reported on 12/05/2015 10/14/15   Servando Salinaossi, Catherine H, NP  HYDROcodone-acetaminophen (NORCO/VICODIN) 5-325 MG tablet Take 1 tablet by mouth every 6 (six) hours as needed. Patient not taking: Reported on 09/24/2016 12/19/15   Cheri Fowlerose, Kayla, PA-C  ibuprofen (ADVIL,MOTRIN) 600 MG tablet Take 1 tablet (600 mg total) by mouth every 6 (six) hours as needed. Patient not taking: Reported on 12/05/2015 11/20/13   Waynard Reedsoss, Kendra, MD    Family History Family History  Problem Relation Age of Onset  . Arthritis Mother   . Hypertension Mother   . Birth defects Neg Hx   . Cancer Neg Hx   . COPD Neg Hx   . Asthma Neg Hx   . Alcohol abuse Neg Hx   . Depression Neg Hx   . Diabetes Neg Hx   . Drug abuse Neg Hx   . Early death Neg Hx   .  Hearing loss Neg Hx   . Heart disease Neg Hx   . Hyperlipidemia Neg Hx   . Learning disabilities Neg Hx   . Kidney disease Neg Hx   . Mental illness Neg Hx   . Mental retardation Neg Hx   . Miscarriages / Stillbirths Neg Hx   . Stroke Neg Hx   . Vision loss Neg Hx   . Varicose Veins Neg Hx     Social History Social History  Substance Use Topics  . Smoking status: Never Smoker  . Smokeless tobacco: Never Used  . Alcohol use No     Allergies   Patient has no known allergies.   Review of Systems Review of Systems  Constitutional: Positive for appetite change and unexpected weight change. Negative for chills and fever.  HENT: Positive for congestion.   Respiratory:  Positive for shortness of breath. Negative for cough and wheezing.   Cardiovascular: Negative for chest pain, palpitations and leg swelling.  Gastrointestinal: Negative for abdominal pain, diarrhea, nausea and vomiting.  Genitourinary: Negative for dysuria.  Neurological: Positive for numbness. Negative for syncope and light-headedness.  Psychiatric/Behavioral: The patient is nervous/anxious.   All other systems reviewed and are negative.    Physical Exam Updated Vital Signs BP 139/90 (BP Location: Right Arm)   Pulse 93   Temp 97.9 F (36.6 C) (Oral)   Resp 18   LMP 09/23/2016   SpO2 100%   Physical Exam  Constitutional: She is oriented to person, place, and time. She appears well-developed and well-nourished. No distress.  Cooperative, NAD  HENT:  Head: Normocephalic and atraumatic.  Eyes: Conjunctivae are normal. Pupils are equal, round, and reactive to light. Right eye exhibits no discharge. Left eye exhibits no discharge. No scleral icterus.  Neck: Normal range of motion.  Cardiovascular: Normal rate and regular rhythm.  Exam reveals no gallop and no friction rub.   No murmur heard. Pulmonary/Chest: Effort normal and breath sounds normal. No respiratory distress. She has no wheezes. She has no rales. She exhibits no tenderness.  Abdominal: Soft. Bowel sounds are normal. She exhibits no distension and no mass. There is no tenderness. There is no rebound and no guarding. No hernia.  Musculoskeletal:  No lower leg edema  Neurological: She is alert and oriented to person, place, and time.  Skin: Skin is warm and dry.  Psychiatric: Her behavior is normal. Her mood appears anxious.  Nursing note and vitals reviewed.    ED Treatments / Results  Labs (all labs ordered are listed, but only abnormal results are displayed) Labs Reviewed  COMPREHENSIVE METABOLIC PANEL - Abnormal; Notable for the following:       Result Value   Potassium 3.0 (*)    CO2 20 (*)    ALT 13 (*)     All other components within normal limits  CBC WITH DIFFERENTIAL/PLATELET  TSH  CBG MONITORING, ED    EKG  EKG Interpretation None       Radiology Dg Chest 2 View  Result Date: 09/24/2016 CLINICAL DATA:  One week of shortness of breath and nonproductive cough. No chest pain. Nonsmoker. EXAM: CHEST  2 VIEW COMPARISON:  None in PACs FINDINGS: The lungs are well-expanded and clear. The heart and pulmonary vascularity are normal. The mediastinum is normal in width. The trachea is midline. The bony thorax exhibits no acute abnormality. IMPRESSION: There is no active cardiopulmonary disease. Electronically Signed   By: David  [place] M.D.   On: 09/24/2016 14:29  Procedures Procedures (including critical care time)  Medications Ordered in ED Medications - No data to display   Initial Impression / Assessment and Plan / ED Course  I have reviewed the triage vital signs and the nursing notes.  Pertinent labs & imaging results that were available during my care of the patient were reviewed by me and considered in my medical decision making (see chart for details).  26 year old female with SOB. Likely anxiety related. Vitals are normal. She is technically not PERC negative due to birth control but doubt PE since her SOB is intermittent and not associated with chest pain or leg swelling. Labs are overall unremarkable. TSH is normal. Discussed results with patient. Advised Hydroxyzine prn for anxiety and follow up with PCP. Return precautions given.  Final Clinical Impressions(s) / ED Diagnoses   Final diagnoses:  SOB (shortness of breath)  Anxiety    New Prescriptions New Prescriptions   No medications on file     Beryle Quant 09/24/16 2130    Lorre Nick, MD 09/25/16 [phone]

## 2016-09-24 NOTE — Discharge Instructions (Signed)
Take Hydroxyzine as needed for anxiety and shortness of breath Follow up with [facility] If you become very short of breath or develop chest pain, return to the ED

## 2016-10-09 ENCOUNTER — Encounter (HOSPITAL_COMMUNITY): Payer: Self-pay

## 2016-10-09 DIAGNOSIS — R51 Headache: Secondary | ICD-10-CM | POA: Insufficient documentation

## 2016-10-09 DIAGNOSIS — H538 Other visual disturbances: Secondary | ICD-10-CM | POA: Insufficient documentation

## 2016-10-09 DIAGNOSIS — F419 Anxiety disorder, unspecified: Secondary | ICD-10-CM | POA: Insufficient documentation

## 2016-10-09 DIAGNOSIS — F339 Major depressive disorder, recurrent, unspecified: Secondary | ICD-10-CM | POA: Insufficient documentation

## 2016-10-09 DIAGNOSIS — R072 Precordial pain: Secondary | ICD-10-CM | POA: Insufficient documentation

## 2016-10-09 NOTE — ED Triage Notes (Signed)
Pt complains of chest pain for one month, stabbing over her left breast and dull in the center She also states that her vision has been blurry today

## 2016-10-10 ENCOUNTER — Encounter (HOSPITAL_COMMUNITY): Payer: Self-pay

## 2016-10-10 ENCOUNTER — Emergency Department (HOSPITAL_COMMUNITY): Payer: Self-pay

## 2016-10-10 ENCOUNTER — Emergency Department (HOSPITAL_COMMUNITY)
Admission: EM | Admit: 2016-10-10 | Discharge: 2016-10-10 | Disposition: A | Discharge status: Home / Self Care | Payer: Self-pay | Attending: Emergency Medicine | Admitting: Emergency Medicine

## 2016-10-10 DIAGNOSIS — R519 Headache, unspecified: Secondary | ICD-10-CM

## 2016-10-10 DIAGNOSIS — F419 Anxiety disorder, unspecified: Secondary | ICD-10-CM

## 2016-10-10 DIAGNOSIS — H538 Other visual disturbances: Secondary | ICD-10-CM

## 2016-10-10 DIAGNOSIS — F331 Major depressive disorder, recurrent, moderate: Secondary | ICD-10-CM

## 2016-10-10 DIAGNOSIS — R51 Headache: Secondary | ICD-10-CM

## 2016-10-10 DIAGNOSIS — R072 Precordial pain: Secondary | ICD-10-CM

## 2016-10-10 HISTORY — DX: Anxiety disorder, unspecified: F41.9

## 2016-10-10 LAB — URINALYSIS, ROUTINE W REFLEX MICROSCOPIC
BILIRUBIN URINE: NEGATIVE
GLUCOSE, UA: NEGATIVE mg/dL
HGB URINE DIPSTICK: NEGATIVE
Ketones, ur: 20 mg/dL — AB
Leukocytes, UA: NEGATIVE
Nitrite: NEGATIVE
PH: 6 (ref 5.0–8.0)
Protein, ur: NEGATIVE mg/dL
SPECIFIC GRAVITY, URINE: 1.013 (ref 1.005–1.030)

## 2016-10-10 LAB — BASIC METABOLIC PANEL
Anion gap: 8 (ref 5–15)
BUN: 6 mg/dL (ref 6–20)
CO2: 24 mmol/L (ref 22–32)
CREATININE: 0.74 mg/dL (ref 0.44–1.00)
Calcium: 9.8 mg/dL (ref 8.9–10.3)
Chloride: 105 mmol/L (ref 101–111)
GFR calc Af Amer: >60 mL/min (ref >60)
Glucose, Bld: 91 mg/dL (ref 65–99)
Potassium: 3.5 mmol/L (ref 3.5–5.1)
SODIUM: 137 mmol/L (ref 135–145)

## 2016-10-10 LAB — I-STAT TROPONIN, ED: Troponin i, poc: 0 ng/mL (ref 0.00–0.08)

## 2016-10-10 LAB — CBC
HCT: 41 % (ref 36.0–46.0)
Hemoglobin: 14.2 g/dL (ref 12.0–15.0)
MCH: 29.7 pg (ref 26.0–34.0)
MCHC: 34.6 g/dL (ref 30.0–36.0)
MCV: 85.8 fL (ref 78.0–100.0)
PLATELETS: 349 10*3/uL (ref 150–400)
RBC: 4.78 MIL/uL (ref 3.87–5.11)
RDW: 12.3 % (ref 11.5–15.5)
WBC: 7.8 10*3/uL (ref 4.0–10.5)

## 2016-10-10 LAB — PREGNANCY, URINE: PREG TEST UR: NEGATIVE

## 2016-10-10 MED ORDER — GI COCKTAIL ~~LOC~~
30.0000 mL | Freq: Once | ORAL | Status: DC
  Filled 2016-10-10: qty 30

## 2016-10-10 NOTE — ED Notes (Signed)
Pt would like MD to visualize her earcanal to insure her "equilibrium" is intact. MD informed.

## 2016-10-10 NOTE — ED Notes (Signed)
Pt decided to leave before MD could return to visualize her ears

## 2016-10-10 NOTE — ED Provider Notes (Addendum)
[facility]-EMERGENCY DEPT Provider Note   CSN: [id number] Arrival date & time: 10/09/16  2313  By signing my name below, I, Karren Cobble, attest that this documentation has been prepared under the direction and in the presence of Azalia Bilis, MD. Electronically Signed: Karren Cobble, ED Scribe. 10/10/16. 4:31 AM.   History   Chief Complaint Chief Complaint  Patient presents with  . Chest Pain  . Blurred Vision   The history is provided by the patient and a parent. No language interpreter was used.    HPI Comments: Lorraine Espinoza is a 26 y.o. female with no pertinent PMHx, who presents to the Emergency Department with multiple complaints. Pt notes constant centralized chest and a new onset of left eye blurred vision that started today. She also reports having nausea, fatigue, appetite change, and significant weight loss (40-50 lbs) within three months. Pt was seen in the ED on 5/8 for chest pain associated with a panic attack. Since this episode she notes persistent centralized chest pain. She notes the chest pain radiated into her left breast and is sharp, and up into her neck when lying flat. Per pt and pt's mother, she has been experiencing chest pain for the past month but associated symptoms continue to increase. No treatment tried PTA. No alleviating factors. Denies vomiting or dysuria, or any other acute associated symptoms at this time.   Past Medical History:  Diagnosis Date  . History of kidney stones   . Hx of pyelonephritis   . Umbilical hernia     Patient Active Problem List   Diagnosis Date Noted  . Supervision of other normal pregnancy 11/18/2013  . Normal labor 11/16/2013  . CYSTITIS, ACUTE 03/18/2007  . GROSS HEMATURIA 03/18/2007  . SYMPTOM, HEADACHE 02/04/2007    Past Surgical History:  Procedure Laterality Date  . CESAREAN SECTION N/A 11/18/2013   Procedure: CESAREAN SECTION;  Surgeon: Levi Aland, MD;  Location: [facility];  Service: Obstetrics;  Laterality:  N/A;    OB History    Gravida Para Term Preterm AB Living   2 2 2     2    SAB TAB Ectopic Multiple Live Births           2       Home Medications    Prior to Admission medications   Medication Sig Start Date End Date Taking? Authorizing Provider  ALPRAZolam (XANAX) 0.25 MG tablet Take 0.25 mg by mouth once.   Yes [provider]  norgestimate-ethinyl estradiol (ORTHO-CYCLEN,SPRINTEC,PREVIFEM) 0.25-35 MG-MCG tablet Take 1 tablet by mouth daily.   Yes [provider]  hydrOXYzine (ATARAX/VISTARIL) 25 MG tablet Take 1 tablet (25 mg total) by mouth every 6 (six) hours. Patient not taking: Reported on 10/10/2016 09/24/16   Bethel Born, PA-C    Family History Family History  Problem Relation Age of Onset  . Arthritis Mother   . Hypertension Mother   . Birth defects Neg Hx   . Cancer Neg Hx   . COPD Neg Hx   . Asthma Neg Hx   . Alcohol abuse Neg Hx   . Depression Neg Hx   . Diabetes Neg Hx   . Drug abuse Neg Hx   . Early death Neg Hx   . Hearing loss Neg Hx   . Heart disease Neg Hx   . Hyperlipidemia Neg Hx   . Learning disabilities Neg Hx   . Kidney disease Neg Hx   . Mental illness Neg Hx   .  Mental retardation Neg Hx   . Miscarriages / Stillbirths Neg Hx   . Stroke Neg Hx   . Vision loss Neg Hx   . Varicose Veins Neg Hx     Social History Social History  Substance Use Topics  . Smoking status: Never Smoker  . Smokeless tobacco: Never Used  . Alcohol use No     Allergies   Patient has no known allergies.   Review of Systems Review of Systems  Constitutional: Positive for appetite change and unexpected weight change.  Eyes: Positive for visual disturbance.  Gastrointestinal: Positive for nausea. Negative for vomiting.  Genitourinary: Positive for frequency. Negative for dysuria.  Musculoskeletal: Positive for neck pain.  Psychiatric/Behavioral: The patient is nervous/anxious.     Physical Exam Updated Vital Signs BP (!) 125/91  (BP Location: Left Arm)   Pulse 89   Temp 98 F (36.7 C) (Oral)   Resp 16   LMP 09/23/2016   SpO2 100%   Physical Exam  Constitutional: She is oriented to person, place, and time. She appears well-developed and well-nourished. No distress.  HENT:  Head: Normocephalic and atraumatic.  Eyes: EOM and lids are normal. Pupils are equal, round, and reactive to light. Lids are everted and swept, no foreign bodies found. Right eye exhibits no chemosis and no discharge. Left eye exhibits no chemosis and no discharge. Right conjunctiva is not injected. Left conjunctiva is not injected. Right eye exhibits no nystagmus. Left eye exhibits no nystagmus.  Neck: Normal range of motion.  Cardiovascular: Normal rate, regular rhythm and normal heart sounds.   Pulmonary/Chest: Effort normal and breath sounds normal.  Abdominal: Soft. She exhibits no distension. There is no tenderness.  Musculoskeletal: Normal range of motion.  Neurological: She is alert and oriented to person, place, and time.  Skin: Skin is warm and dry.  Psychiatric: She has a normal mood and affect. Judgment normal.  Nursing note and vitals reviewed.    ED Treatments / Results  DIAGNOSTIC STUDIES: Oxygen Saturation is 100% on RA, normal by my interpretation.   COORDINATION OF CARE: 2:17 AM-Discussed next steps with pt. Pt verbalized understanding and is agreeable with the plan.   Labs (all labs ordered are listed, but only abnormal results are displayed) Labs Reviewed  URINALYSIS, ROUTINE W REFLEX MICROSCOPIC - Abnormal; Notable for the following:       Result Value   Ketones, ur 20 (*)    All other components within normal limits  BASIC METABOLIC PANEL  CBC  PREGNANCY, URINE  I-STAT TROPOININ, ED    EKG  EKG Interpretation  Date/Time:  Wednesday Oct 09 2016 23:36:56 EDT Ventricular Rate:  83 PR Interval:    QRS Duration: 87 QT Interval:  357 QTC Calculation: 420 R Axis:   42 Text Interpretation:  Sinus rhythm  Biatrial enlargement No significant change was found Confirmed by Loree Shehata  MD, Caryn Bee ([id number]) on 10/10/2016 2:09:17 AM       Radiology Dg Chest 2 View  Result Date: 10/10/2016 CLINICAL DATA:  Acute onset of mid chest pain, radiating to the left breast. Initial encounter. EXAM: CHEST  2 VIEW COMPARISON:  Chest radiograph performed 09/24/2016 FINDINGS: The lungs are well-aerated and clear. There is no evidence of focal opacification, pleural effusion or pneumothorax. The heart is normal in size; the mediastinal contour is within normal limits. No acute osseous abnormalities are seen. IMPRESSION: No acute cardiopulmonary process seen. Electronically Signed   By: Roanna Raider M.D.   On: 10/10/2016 01:22  Ct Head Wo Contrast  Result Date: 10/10/2016 CLINICAL DATA:  Acute onset of posterior and frontal headache, and left-sided blurred vision. Initial encounter. EXAM: CT HEAD WITHOUT CONTRAST TECHNIQUE: Contiguous axial images were obtained from the base of the skull through the vertex without intravenous contrast. COMPARISON:  CT of the head performed 11/04/2004 FINDINGS: Brain: No evidence of acute infarction, hemorrhage, hydrocephalus, extra-axial collection or mass lesion/mass effect. The posterior fossa, including the cerebellum, brainstem and fourth ventricle, is within normal limits. The third and lateral ventricles, and basal ganglia are unremarkable in appearance. The cerebral hemispheres are symmetric in appearance, with normal gray-white differentiation. No mass effect or midline shift is seen. Vascular: No hyperdense vessel or unexpected calcification. Skull: There is no evidence of fracture; visualized osseous structures are unremarkable in appearance. Sinuses/Orbits: The visualized portions of the orbits are within normal limits. The paranasal sinuses and mastoid air cells are well-aerated. Other: No significant soft tissue abnormalities are seen. IMPRESSION: Unremarkable noncontrast CT of the  head. Electronically Signed   By: Roanna RaiderJeffery  Chang M.D.   On: 10/10/2016 03:57    Procedures Procedures (including critical care time)  Medications Ordered in ED Medications  gi cocktail (Maalox,Lidocaine,Donnatal) (30 mLs Oral Refused 10/10/16 0440)     Initial Impression / Assessment and Plan / ED Course  I have reviewed the triage vital signs and the nursing notes.  Pertinent labs & imaging results that were available during my care of the patient were reviewed by me and considered in my medical decision making (see chart for details).     Patient is overall well-appearing.  Discharge home in good condition.  There is a significant component of this that his psychosocial.  She is very tearful the bedside.  She is having depression and anxiety.  I have stressed the importance of outpatient psychiatry and therapy follow-up.  She needs to develop a relationship with a primary care physician.  Her mother is completely on board with all of this and agrees with my assessment  Final Clinical Impressions(s) / ED Diagnoses   Final diagnoses:  Moderate episode of recurrent major depressive disorder (HCC)  Anxiety  Precordial pain  Nonintractable headache, unspecified chronicity pattern, unspecified headache type  Blurred vision    New Prescriptions New Prescriptions   No medications on file   I personally performed the services described in this documentation, which was scribed in my presence. The recorded information has been reviewed and is accurate.        Azalia Bilisampos, Carsen Machi, MD 10/10/16 [id number]    Azalia Bilisampos, Kyleen Villatoro, MD 11/05/16 [phone]

## 2016-10-10 NOTE — ED Notes (Signed)
Patient transported to X-ray 

## 2016-10-10 NOTE — ED Notes (Signed)
Pt and family updated on reason behind CT delay.

## 2017-03-06 ENCOUNTER — Ambulatory Visit: Payer: Self-pay | Admitting: General Surgery

## 2018-02-25 IMAGING — CT CT HEAD W/O CM
3 of 4 series · 15 of 47 positions shown, 18 images · non-contrast
Comparison: CT of the head performed 11/04/2004

CLINICAL DATA: Acute onset of posterior and frontal headache, and
left-sided blurred vision. Initial encounter.

EXAM:
CT HEAD WITHOUT CONTRAST
TECHNIQUE: Contiguous axial images were obtained from the base of the skull
through the vertex without intravenous contrast.

[Series 2: head w/o · axial · non-contrast · 0.45mm/px · z∈[-183,-63]mm · 9 of 30 slices shown, 12 images]
[im 3/30  brain]
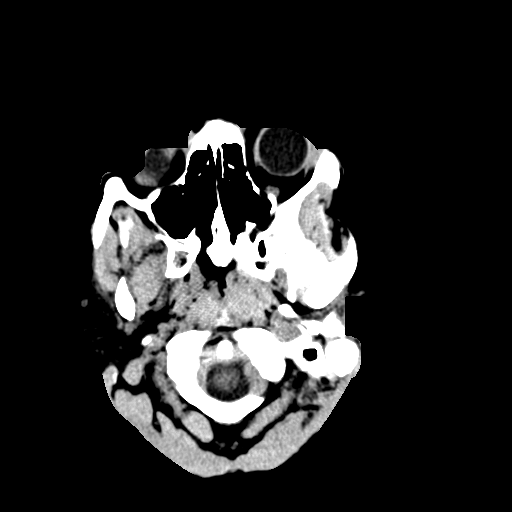
[im 3/30  bone]
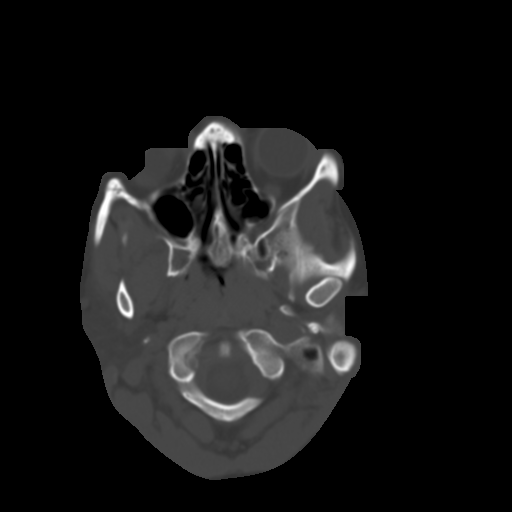
[im 7/30  brain]
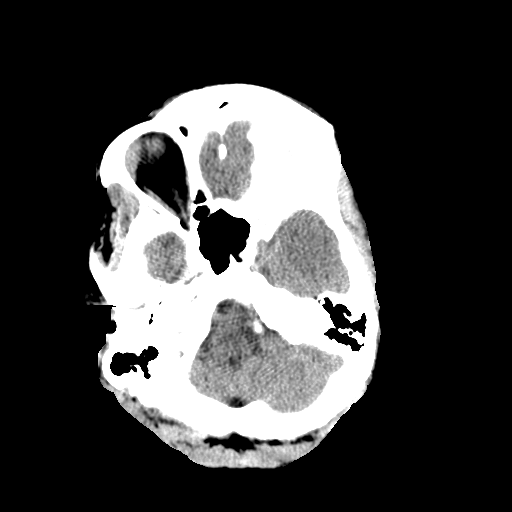
[im 9/30  brain]
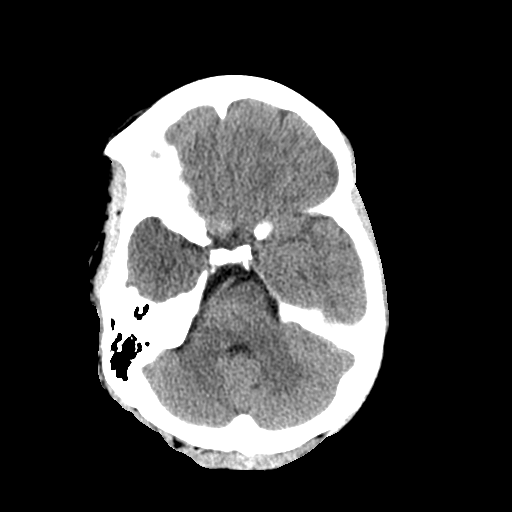
[im 13/30  brain]
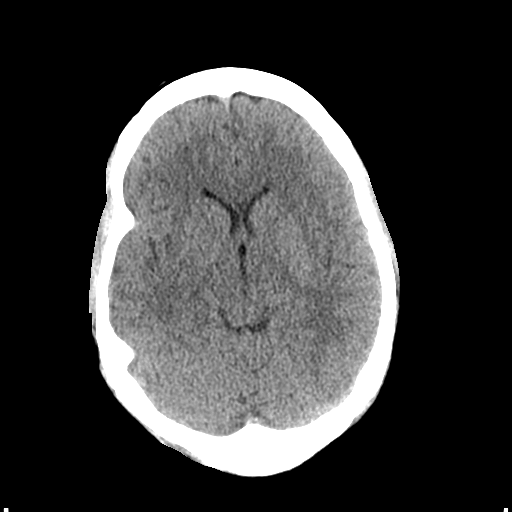
[im 15/30  brain]
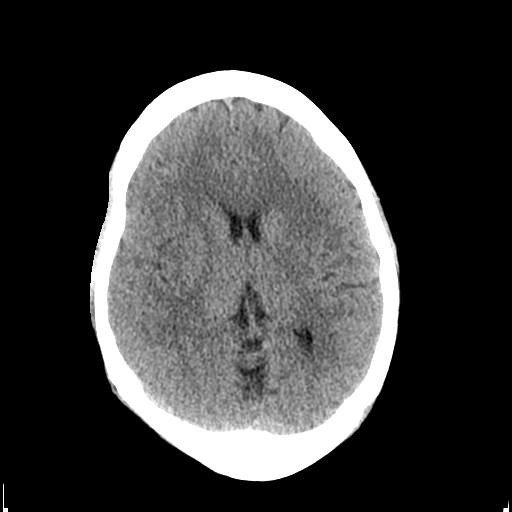
[im 15/30  bone]
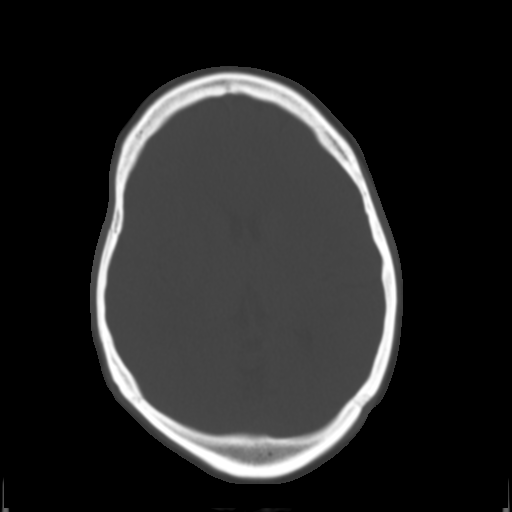
[im 17/30  brain]
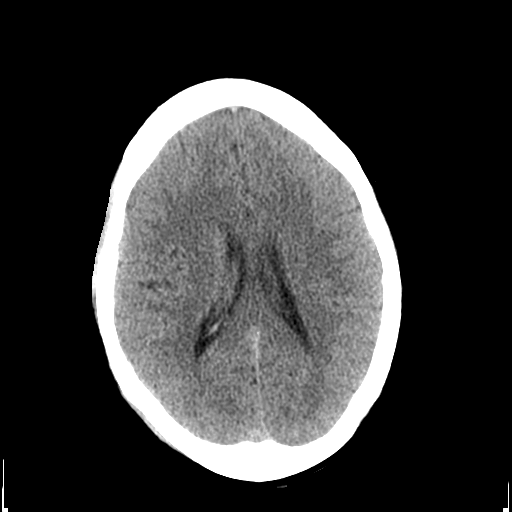
[im 21/30  brain]
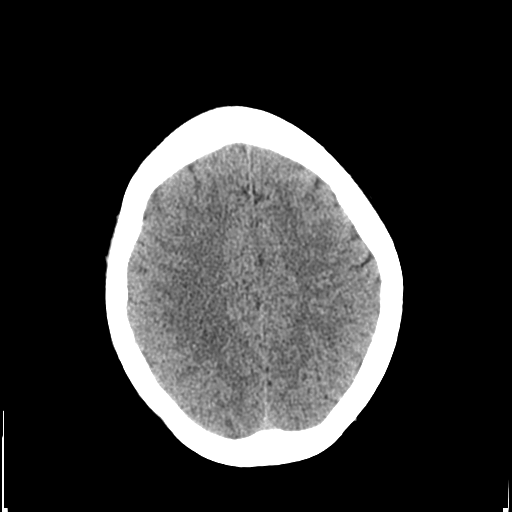
[im 23/30  brain]
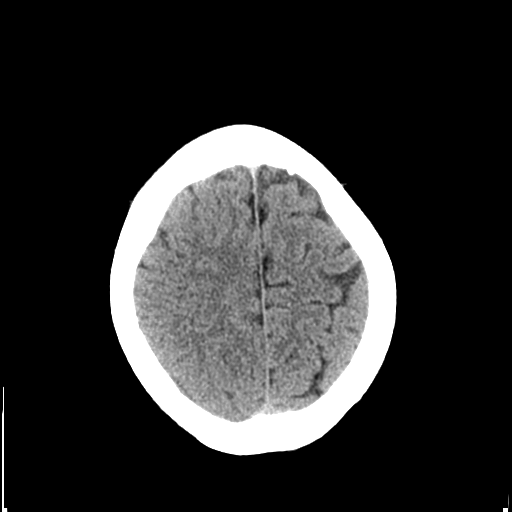
[im 27/30  brain]
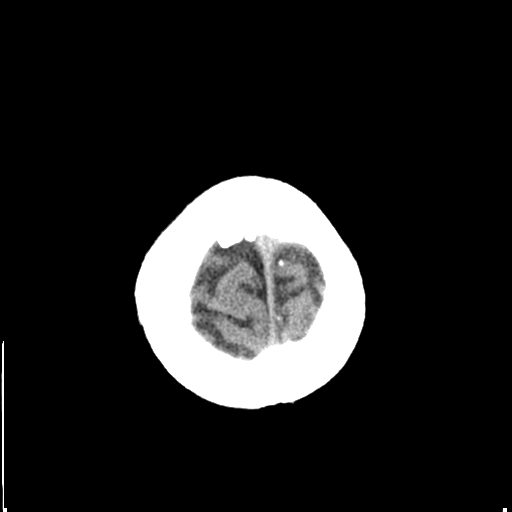
[im 27/30  bone]
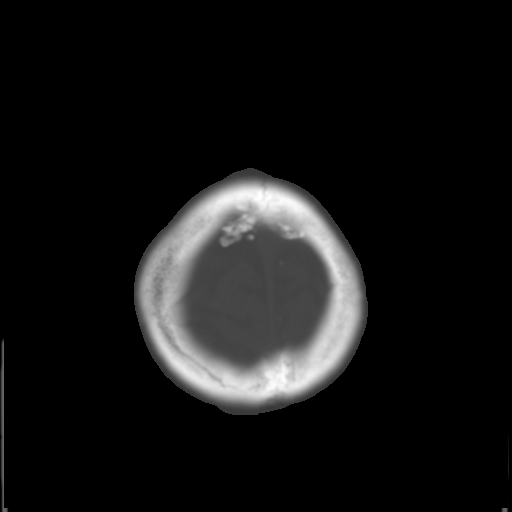

[Series 4: coronal · coronal · 0.27mm/px · 3 of 62 slices shown]
[im 21/62  brain]
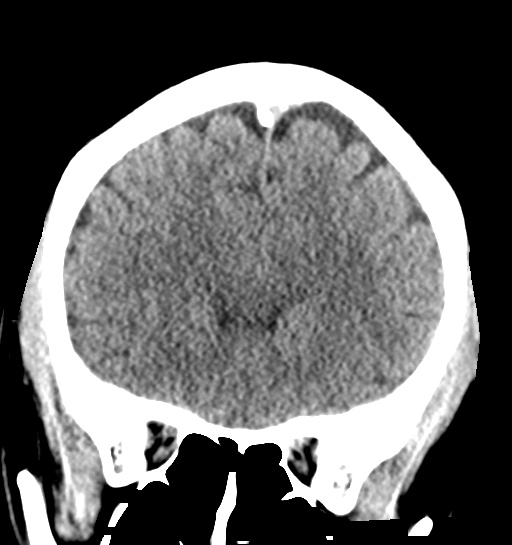
[im 28/62  brain]
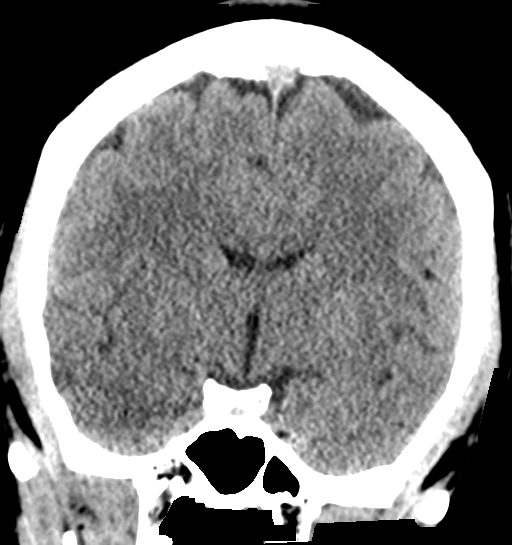
[im 34/62  brain]
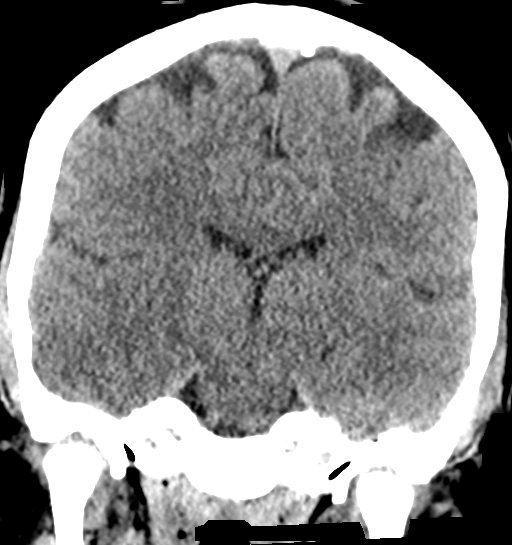

[Series 5: sagittal · sagittal · 0.29mm/px · 3 of 47 slices shown]
[im 16/47  brain]
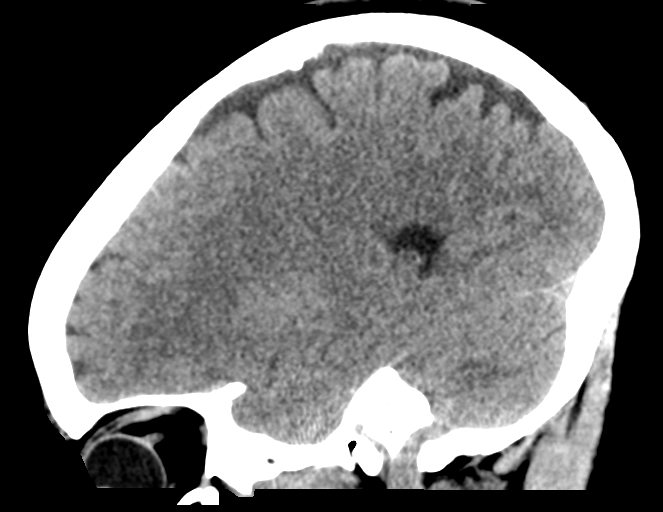
[im 24/47  brain]
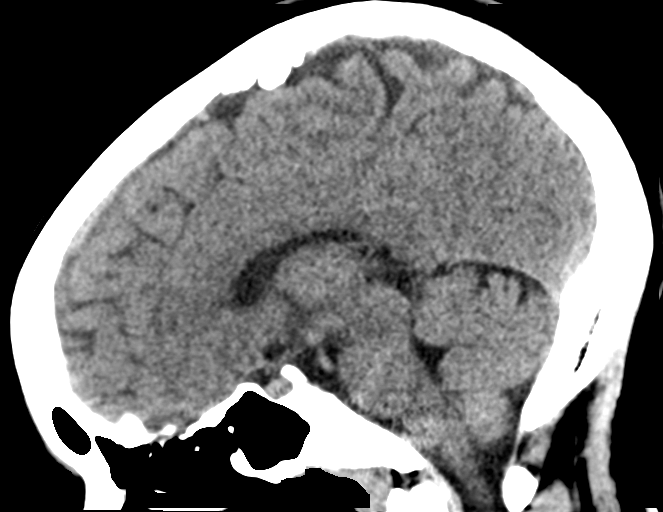
[im 31/47  brain]
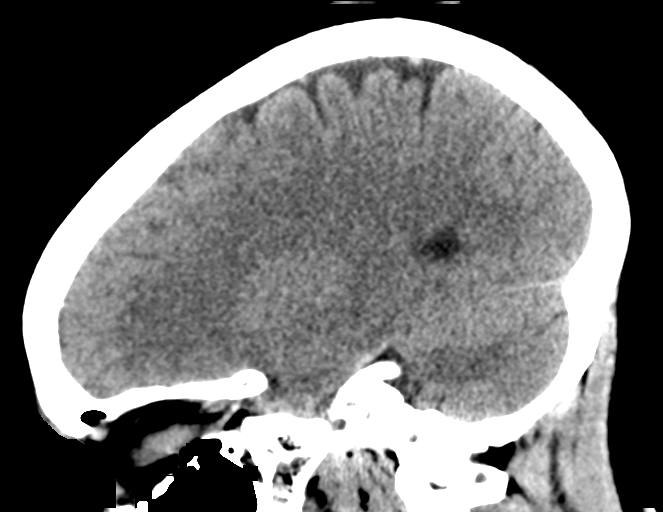

[15 of 47 positions shown; findings below may reference images not displayed]

FINDINGS: Brain: No evidence of acute infarction, hemorrhage, hydrocephalus,
extra-axial collection or mass lesion/mass effect.

The posterior fossa, including the cerebellum, brainstem and fourth
ventricle, is within normal limits. The third and lateral
ventricles, and basal ganglia are unremarkable in appearance. The
cerebral hemispheres are symmetric in appearance, with normal
gray-white differentiation. No mass effect or midline shift is seen.

Vascular: No hyperdense vessel or unexpected calcification.

Skull: There is no evidence of fracture; visualized osseous
structures are unremarkable in appearance.

Sinuses/Orbits: The visualized portions of the orbits are within
normal limits. The paranasal sinuses and mastoid air cells are
well-aerated.

Other: No significant soft tissue abnormalities are seen.
IMPRESSION: Unremarkable noncontrast CT of the head.

## 2019-10-14 ENCOUNTER — Emergency Department (HOSPITAL_COMMUNITY): Payer: No Typology Code available for payment source

## 2019-10-14 ENCOUNTER — Other Ambulatory Visit: Payer: Self-pay

## 2019-10-14 ENCOUNTER — Emergency Department (HOSPITAL_COMMUNITY)
Admission: EM | Admit: 2019-10-14 | Discharge: 2019-10-14 | Disposition: A | Discharge status: Home / Self Care | Payer: No Typology Code available for payment source | Attending: Emergency Medicine | Admitting: Emergency Medicine

## 2019-10-14 DIAGNOSIS — R0789 Other chest pain: Secondary | ICD-10-CM | POA: Insufficient documentation

## 2019-10-14 DIAGNOSIS — Z793 Long term (current) use of hormonal contraceptives: Secondary | ICD-10-CM | POA: Diagnosis not present

## 2019-10-14 MED ORDER — CYCLOBENZAPRINE HCL 10 MG PO TABS: 10.0000 mg | ORAL_TABLET | Freq: Two times a day (BID) | ORAL | 0 refills | Status: AC | PRN

## 2019-10-14 MED ORDER — HYDROCODONE-ACETAMINOPHEN 5-325 MG PO TABS
1.0000 | ORAL_TABLET | Freq: Once | ORAL | Status: AC
  Administered 2019-10-14: 1 via ORAL
  Filled 2019-10-14: qty 1
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
## 2019-10-14 NOTE — Discharge Instructions (Addendum)
Please read instructions below. Apply ice to your areas of pain for 20 minutes at a time. You can take 600 mg of Advil/ibuprofen every 6 hours as needed for pain. You can take flexeril every 12 hours as needed for muscle spasm. Be aware this medication can make you drowsy. Schedule an appointment with your primary care provider to follow up on your visit today. Return to the ER for shortness of breath, worsening chest pain, if new numbness or tingling in your arms or legs, inability to urinate, inability to hold your bowels, or weakness in your extremities.

## 2019-10-14 NOTE — ED Notes (Signed)
Sign-pad unavailable upon discharge. Pt provided with and verbalizes understanding od discharge insturctions. A&ox4, and ambulatory. Wrist band removed

## 2021-04-11 ENCOUNTER — Encounter: Payer: Medicaid Other | Admitting: Obstetrics & Gynecology

## 2021-06-11 ENCOUNTER — Encounter: Payer: Self-pay | Admitting: Obstetrics & Gynecology

## 2021-06-11 ENCOUNTER — Other Ambulatory Visit (HOSPITAL_COMMUNITY)
Admission: RE | Admit: 2021-06-11 | Discharge: 2021-06-11 | Disposition: A | Discharge status: Home / Self Care | Payer: Medicaid Other | Source: Ambulatory Visit | Attending: Obstetrics & Gynecology | Admitting: Obstetrics & Gynecology

## 2021-06-11 ENCOUNTER — Other Ambulatory Visit: Payer: Self-pay

## 2021-06-11 ENCOUNTER — Ambulatory Visit (INDEPENDENT_AMBULATORY_CARE_PROVIDER_SITE_OTHER): Payer: Medicaid Other | Admitting: Obstetrics & Gynecology

## 2021-06-11 VITALS — BP 115/71 | HR 96 | Ht 64.0 in | Wt 170.1 lb

## 2021-06-11 DIAGNOSIS — N898 Other specified noninflammatory disorders of vagina: Secondary | ICD-10-CM

## 2021-06-11 DIAGNOSIS — R399 Unspecified symptoms and signs involving the genitourinary system: Secondary | ICD-10-CM | POA: Diagnosis not present

## 2021-06-11 DIAGNOSIS — N760 Acute vaginitis: Secondary | ICD-10-CM

## 2021-06-11 LAB — POCT URINALYSIS DIP (DEVICE)
Bilirubin Urine: NEGATIVE
Glucose, UA: NEGATIVE mg/dL
Hgb urine dipstick: NEGATIVE
Leukocytes,Ua: NEGATIVE
Nitrite: NEGATIVE
Protein, ur: NEGATIVE mg/dL
Specific Gravity, Urine: 1.02 (ref 1.005–1.030)
Urobilinogen, UA: 0.2 mg/dL (ref 0.0–1.0)
pH: 7 (ref 5.0–8.0)

## 2021-06-11 NOTE — Patient Instructions (Addendum)
Research Hylafem - boric acid vaginal capsules [facility]  or other hyaluronic acid moisturizers for the vaginal  Medical intervention (usually for postmenopausal patients) Vaginal estrogen Osphena  Please follow up with Urology for urinary symptoms  or to be evaluated for interstitial cystitis

## 2021-06-11 NOTE — Progress Notes (Signed)
GYNECOLOGY OFFICE VISIT NOTE  History:   Lorraine Espinoza is a 31 y.o. [phone] here today for evaluation of vulvar and vaginal dryness and irritation for several months. Has used sensitive skin Dove and other vaginal formulation to no avail.  Has been treated for BV last year, denies any discharge currently.She is on Sprintec for birth control but feels like she has low estrogen levels.   Also has bladder irritation symptoms like a UTI, but negative UA evaluations. Has been followed by Urology in the past, started on Oxybutynin. She has not been formally evaluated for interstitial cystitis. She denies any abnormal vaginal discharge, bleeding, pelvic pain or other concerns.    Past Medical History:  Diagnosis Date   Anxiety    History of kidney stones    Hx of pyelonephritis    Umbilical hernia    UTI (urinary tract infection)     Past Surgical History:  Procedure Laterality Date   CESAREAN SECTION N/A 11/18/2013   Procedure: CESAREAN SECTION;  Surgeon: Levi Aland, MD;  Location: [facility];  Service: Obstetrics;  Laterality: N/A;    The following portions of the patient's history were reviewed and updated as appropriate: allergies, current medications, past family history, past medical history, past social history, past surgical history and problem list.   Health Maintenance:  ASCUS pap and negative HRHPV on 10/2020 at [facility].   Review of Systems:  Pertinent items noted in HPI and remainder of comprehensive ROS otherwise negative.  Physical Exam:  BP 115/71    Pulse 96    Ht 5\' 4"  (1.626 m)    Wt 170 lb 1.6 oz (77.2 kg)    LMP 05/23/2021    BMI 29.20 kg/m  CONSTITUTIONAL: Well-developed, well-nourished female in no acute distress.  HEENT:  Normocephalic, atraumatic. External right and left ear normal. No scleral icterus.  NECK: Normal range of motion, supple, no masses noted on observation SKIN: No rash noted. Not diaphoretic. No erythema. No pallor. MUSCULOSKELETAL: Normal  range of motion. No edema noted. NEUROLOGIC: Alert and oriented to person, place, and time. Normal muscle tone coordination. No cranial nerve deficit noted. PSYCHIATRIC: Normal mood and affect. Normal behavior. Normal judgment and thought content. CARDIOVASCULAR: Normal heart rate noted RESPIRATORY: Effort and breath sounds normal, no problems with respiration noted ABDOMEN: No masses noted. No other overt distention noted.   PELVIC: Normal appearing external genitalia; normal urethral meatus; normal appearing and well-ruggated vaginal mucosa and cervix.  Thick white yellow vaginal discharge noted, testing sample obtained.   Performed in the presence of a chaperone  Labs and Imaging Results for orders placed or performed in visit on 06/11/21 (from the past 168 hour(s))  POCT urinalysis dip (device)   Collection Time: 06/11/21  2:19 PM  Result Value Ref Range   Glucose, UA NEGATIVE NEGATIVE mg/dL   Bilirubin Urine NEGATIVE NEGATIVE   Ketones, ur TRACE (A) NEGATIVE mg/dL   Specific Gravity, Urine 1.020 1.005 - 1.030   Hgb urine dipstick NEGATIVE NEGATIVE   pH 7.0 5.0 - 8.0   Protein, ur NEGATIVE NEGATIVE mg/dL   Urobilinogen, UA 0.2 0.0 - 1.0 mg/dL   Nitrite NEGATIVE NEGATIVE   Leukocytes,Ua NEGATIVE NEGATIVE       Assessment and Plan:     1. Vulvovaginitis 2. Vaginal dryness Unlikely that she is hypoestrogenic, will check levels for reassurance. She is currently one week prior to her period, will interpret results accordingly.  Also checked vaginitis panel, will follow up results and  manage accordingly.  If symptoms continue and tests are reassuring, patient was informed about Hylafem use which can help in reestablishing proper vaginal pH balance and hyaluronic acid hypoallergenic moisturizers that can be used as needed.  If these do not work, may do a course of vaginal estrogen (this is usually done in hypoestrogenic states such as menopause)  - Estrogens, total - Cervicovaginal  ancillary only  3. UTI symptoms Negative UA today.  Patient told to follow up with Urology for further evaluation.   Routine preventative health maintenance measures emphasized. Please refer to After Visit Summary for other counseling recommendations.   Return for any gynecologic concerns.    I spent 30 minutes dedicated to the care of this patient including pre-visit review of records, face to face time with the patient discussing her conditions and treatments and post visit orders.    Jaynie Collins, MD, FACOG Obstetrician & Gynecologist, [facility] for [employer], [facility]

## 2021-06-12 LAB — CERVICOVAGINAL ANCILLARY ONLY
Bacterial Vaginitis (gardnerella): NEGATIVE
Candida Glabrata: NEGATIVE
Candida Vaginitis: NEGATIVE
Chlamydia: NEGATIVE
Comment: NEGATIVE
Comment: NEGATIVE
Comment: NEGATIVE
Comment: NEGATIVE
Comment: NEGATIVE
Comment: NORMAL
Neisseria Gonorrhea: NEGATIVE
Trichomonas: NEGATIVE

## 2021-06-14 ENCOUNTER — Encounter: Payer: Self-pay | Admitting: Obstetrics & Gynecology

## 2021-06-14 LAB — ESTROGENS, TOTAL: Estrogen: 36 pg/mL

## 2021-08-21 ENCOUNTER — Encounter: Payer: Self-pay | Admitting: Emergency Medicine

## 2021-08-21 ENCOUNTER — Ambulatory Visit
Admission: EM | Admit: 2021-08-21 | Discharge: 2021-08-21 | Disposition: A | Discharge status: Home / Self Care | Payer: Medicaid Other | Attending: Internal Medicine | Admitting: Internal Medicine

## 2021-08-21 ENCOUNTER — Other Ambulatory Visit: Payer: Self-pay

## 2021-08-21 DIAGNOSIS — B349 Viral infection, unspecified: Secondary | ICD-10-CM

## 2021-08-21 DIAGNOSIS — J029 Acute pharyngitis, unspecified: Secondary | ICD-10-CM | POA: Diagnosis present

## 2021-08-21 LAB — POCT RAPID STREP A (OFFICE): Rapid Strep A Screen: NEGATIVE

## 2021-08-21 MED ORDER — FLUTICASONE PROPIONATE 50 MCG/ACT NA SUSP
1.0000 | Freq: Every day | NASAL | 0 refills | Status: AC
Start: 2021-08-21 — End: 2021-08-24

## 2021-08-21 NOTE — ED Provider Notes (Signed)
?[facility] ? ? ? ?CSN: [id number] ?Arrival date & time: 08/21/21  [id number] ? ? ?  ? ?History   ?Chief Complaint ?Chief Complaint  ?Patient presents with  ? Sore Throat  ? ? ?HPI ?Lorraine Espinoza is a 31 y.o. female.  ? ?Patient presents with a sore throat and body aches that has been present for 2 days.  She reports that she had nasal congestion and cough a few days prior that have now resolved.  Denies any known fevers.  Her daughter has had similar symptoms recently.  Denies chest pain, shortness of breath, nausea, vomiting, diarrhea, abdominal pain.  Patient has taken allergy medications with minimal improvement in symptoms. ? ? ?Sore Throat ? ? ?Past Medical History:  ?Diagnosis Date  ? Anxiety   ? History of kidney stones   ? Hx of pyelonephritis   ? Umbilical hernia   ? UTI (urinary tract infection)   ? ? ?There are no problems to display for this patient. ? ? ?Past Surgical History:  ?Procedure Laterality Date  ? CESAREAN SECTION N/A 11/18/2013  ? Procedure: CESAREAN SECTION;  Surgeon: Levi AlandMark E Anderson, MD;  Location: [facility];  Service: Obstetrics;  Laterality: N/A;  ? ? ?OB History   ? ? Gravida  ?2  ? Para  ?2  ? Term  ?2  ? Preterm  ?   ? AB  ?   ? Living  ?2  ?  ? ? SAB  ?   ? IAB  ?   ? Ectopic  ?   ? Multiple  ?   ? Live Births  ?2  ?   ?  ?  ? ? ? ?Home Medications   ? ?Prior to Admission medications   ?Medication Sig Start Date End Date Taking? Authorizing Provider  ?fluticasone (FLONASE) 50 MCG/ACT nasal spray Place 1 spray into both nostrils daily for 3 days. 08/21/21 08/24/21 Yes Gustavus BryantMound, Eidan Muellner E, FNP  ?ALPRAZolam (XANAX) 0.25 MG tablet Take 0.25 mg by mouth once.    [provider]  ?cyclobenzaprine (FLEXERIL) 10 MG tablet Take 1 tablet (10 mg total) by mouth 2 (two) times daily as needed for muscle spasms. ?Patient not taking: Reported on 08/21/2021 10/14/19   Robinson, [place] N, PA-C  ?norgestimate-ethinyl estradiol (ORTHO-CYCLEN,SPRINTEC,PREVIFEM) 0.25-35 MG-MCG tablet Take 1 tablet by  mouth daily.    [provider]  ?oxybutynin (DITROPAN-XL) 10 MG 24 hr tablet Take 10 mg by mouth at bedtime. As needed    [provider]  ? ? ?Family History ?Family History  ?Problem Relation Age of Onset  ? Arthritis Mother   ? Hypertension Mother   ? Birth defects Neg Hx   ? Cancer Neg Hx   ? COPD Neg Hx   ? Asthma Neg Hx   ? Alcohol abuse Neg Hx   ? Depression Neg Hx   ? Diabetes Neg Hx   ? Drug abuse Neg Hx   ? Early death Neg Hx   ? Hearing loss Neg Hx   ? Heart disease Neg Hx   ? Hyperlipidemia Neg Hx   ? Learning disabilities Neg Hx   ? Kidney disease Neg Hx   ? Mental illness Neg Hx   ? Mental retardation Neg Hx   ? Miscarriages / Stillbirths Neg Hx   ? Stroke Neg Hx   ? Vision loss Neg Hx   ? Varicose Veins Neg Hx   ? ? ?Social History ?Social History  ? ?Tobacco Use  ?  Smoking status: Never  ? Smokeless tobacco: Never  ?Substance Use Topics  ? Alcohol use: No  ? Drug use: No  ? ? ? ?Allergies   ?Paxil [paroxetine] ? ? ?Review of Systems ?Review of Systems ?Per HPI ? ?Physical Exam ?Triage Vital Signs ?ED Triage Vitals  ?Enc Vitals Group  ?   BP 08/21/21 0854 120/86  ?   Pulse Rate 08/21/21 0854 91  ?   Resp 08/21/21 0854 18  ?   Temp 08/21/21 0854 98 ?F (36.7 ?C)  ?   Temp Source 08/21/21 0854 Oral  ?   SpO2 08/21/21 0854 97 %  ?   Weight --   ?   Height --   ?   Head Circumference --   ?   Peak Flow --   ?   Pain Score 08/21/21 0855 5  ?   Pain Loc --   ?   Pain Edu? --   ?   Excl. in GC? --   ? ?No data found. ? ?Updated Vital Signs ?BP 120/86 (BP Location: Left Arm)   Pulse 91   Temp 98 ?F (36.7 ?C) (Oral)   Resp 18   SpO2 97%  ? ?Visual Acuity ?Right Eye Distance:   ?Left Eye Distance:   ?Bilateral Distance:   ? ?Right Eye Near:   ?Left Eye Near:    ?Bilateral Near:    ? ?Physical Exam ?Constitutional:   ?   General: She is not in acute distress. ?   Appearance: Normal appearance. She is not toxic-appearing or diaphoretic.  ?HENT:  ?   Head: Normocephalic and atraumatic.  ?    Right Ear: Tympanic membrane and ear canal normal.  ?   Left Ear: Tympanic membrane and ear canal normal.  ?   Nose: Congestion present.  ?   Mouth/Throat:  ?   Mouth: Mucous membranes are moist.  ?   Pharynx: Posterior oropharyngeal erythema present.  ?Eyes:  ?   Extraocular Movements: Extraocular movements intact.  ?   Conjunctiva/sclera: Conjunctivae normal.  ?   Pupils: Pupils are equal, round, and reactive to light.  ?Cardiovascular:  ?   Rate and Rhythm: Normal rate and regular rhythm.  ?   Pulses: Normal pulses.  ?   Heart sounds: Normal heart sounds.  ?Pulmonary:  ?   Effort: Pulmonary effort is normal. No respiratory distress.  ?   Breath sounds: Normal breath sounds. No stridor. No wheezing, rhonchi or rales.  ?Abdominal:  ?   General: Abdomen is flat. Bowel sounds are normal.  ?   Palpations: Abdomen is soft.  ?Musculoskeletal:     ?   General: Normal range of motion.  ?   Cervical back: Normal range of motion.  ?Skin: ?   General: Skin is warm and dry.  ?Neurological:  ?   General: No focal deficit present.  ?   Mental Status: She is alert and oriented to person, place, and time. Mental status is at baseline.  ?Psychiatric:     ?   Mood and Affect: Mood normal.     ?   Behavior: Behavior normal.  ? ? ? ?UC Treatments / Results  ?Labs ?(all labs ordered are listed, but only abnormal results are displayed) ?Labs Reviewed  ?CULTURE, GROUP A STREP [facility])  ?COVID-19, FLU A+B NAA  ?POCT RAPID STREP A (OFFICE)  ? ? ?EKG ? ? ?Radiology ?No results found. ? ?Procedures ?Procedures (including critical care time) ? ?Medications Ordered in  UC ?Medications - No data to display ? ?Initial Impression / Assessment and Plan / UC Course  ?I have reviewed the triage vital signs and the nursing notes. ? ?Pertinent labs & imaging results that were available during my care of the patient were reviewed by me and considered in my medical decision making (see chart for details). ? ?  ? ?Patient presents with symptoms likely from  a viral upper respiratory infection. Differential includes bacterial pneumonia, sinusitis, allergic rhinitis, COVID-19, flu. Do not suspect underlying cardiopulmonary process. Symptoms seem unlikely related to ACS, CHF or COPD exacerbations, pneumonia, pneumothorax. Patient is nontoxic appearing and not in need of emergent medical intervention.  Strep was negative.  Throat culture, COVID-19, flu test pending. ? ?Recommended symptom control with over the counter medications.Patient sent Flonase. ? ?Return if symptoms fail to improve in 1-2 weeks or you develop shortness of breath, chest pain, severe headache. Patient states understanding and is agreeable. ? ?Discharged with PCP followup.  ?Final Clinical Impressions(s) / UC Diagnoses  ? ?Final diagnoses:  ?Viral illness  ?Sore throat  ? ? ? ?Discharge Instructions   ? ?  ?Strep is negative.  Throat culture and COVID test are pending.  It appears that you have a viral illness that should self resolve in the next few days with symptomatic treatment.  A nasal spray has been prescribed for you.  Please follow-up if symptoms persist or worsen. ? ? ? ?ED Prescriptions   ? ? Medication Sig Dispense Auth. Provider  ? fluticasone (FLONASE) 50 MCG/ACT nasal spray Place 1 spray into both nostrils daily for 3 days. 16 g Gustavus Bryant, [place]  ? ?  ? ?PDMP not reviewed this encounter. ?  ?Gustavus Bryant, [place] ?08/21/21 [id number] ? ?

## 2021-08-21 NOTE — ED Triage Notes (Signed)
Pt here for sore throat and body aches x 2 days 

## 2021-08-21 NOTE — Discharge Instructions (Signed)
Strep is negative.  Throat culture and COVID test are pending.  It appears that you have a viral illness that should self resolve in the next few days with symptomatic treatment.  A nasal spray has been prescribed for you.  Please follow-up if symptoms persist or worsen. ?

## 2021-08-23 LAB — COVID-19, FLU A+B NAA
Influenza A, NAA: NOT DETECTED
Influenza B, NAA: NOT DETECTED
SARS-CoV-2, NAA: NOT DETECTED

## 2021-08-24 LAB — CULTURE, GROUP A STREP (THRC)
